# Patient Record
Sex: Male | Born: 1985 | Race: Black or African American | Hispanic: No | Smoking: Current some day smoker
Health system: Southern US, Community
[De-identification: ages and names within clinical notes are randomized; demographics above are authoritative.]

## PROBLEM LIST (undated history)

## (undated) DIAGNOSIS — R011 Cardiac murmur, unspecified: Secondary | ICD-10-CM

---

## 2010-09-07 ENCOUNTER — Emergency Department (HOSPITAL_COMMUNITY): Admission: EM | Admit: 2010-09-07 | Discharge: 2010-09-07 | Payer: Self-pay | Admitting: Emergency Medicine

## 2013-02-20 ENCOUNTER — Encounter (HOSPITAL_COMMUNITY): Payer: Self-pay | Admitting: Emergency Medicine

## 2013-02-20 ENCOUNTER — Emergency Department (HOSPITAL_COMMUNITY): Payer: Self-pay

## 2013-02-20 ENCOUNTER — Emergency Department (HOSPITAL_COMMUNITY)
Admission: EM | Admit: 2013-02-20 | Discharge: 2013-02-20 | Disposition: A | Discharge status: Home / Self Care | Payer: Self-pay | Attending: Emergency Medicine | Admitting: Emergency Medicine

## 2013-02-20 DIAGNOSIS — R011 Cardiac murmur, unspecified: Secondary | ICD-10-CM | POA: Insufficient documentation

## 2013-02-20 DIAGNOSIS — Z862 Personal history of diseases of the blood and blood-forming organs and certain disorders involving the immune mechanism: Secondary | ICD-10-CM | POA: Insufficient documentation

## 2013-02-20 DIAGNOSIS — Z87828 Personal history of other (healed) physical injury and trauma: Secondary | ICD-10-CM | POA: Insufficient documentation

## 2013-02-20 DIAGNOSIS — G8911 Acute pain due to trauma: Secondary | ICD-10-CM | POA: Insufficient documentation

## 2013-02-20 DIAGNOSIS — M79673 Pain in unspecified foot: Secondary | ICD-10-CM

## 2013-02-20 DIAGNOSIS — Z8639 Personal history of other endocrine, nutritional and metabolic disease: Secondary | ICD-10-CM | POA: Insufficient documentation

## 2013-02-20 HISTORY — DX: Cardiac murmur, unspecified: R01.1

## 2013-02-20 MED ORDER — OXYCODONE-ACETAMINOPHEN 5-325 MG PO TABS
2.0000 | ORAL_TABLET | Freq: Once | ORAL | Status: AC
  Administered 2013-02-20: 2 via ORAL
  Filled 2013-02-20: qty 2

## 2013-02-20 MED ORDER — OXYCODONE-ACETAMINOPHEN 5-325 MG PO TABS: 2.0000 | ORAL_TABLET | ORAL | Status: DC | PRN

## 2013-02-20 MED ORDER — IBUPROFEN 800 MG PO TABS: 800.0000 mg | ORAL_TABLET | Freq: Three times a day (TID) | ORAL | Status: DC

## 2013-02-20 NOTE — ED Provider Notes (Signed)
History    This chart was scribed for Glynn Octave, MD by Charolett Bumpers, ED Scribe. The patient was seen in room APA09/APA09. Patient's care was started at 1332.   CSN: [id number]  Arrival date & time 02/20/13  1311   First MD Initiated Contact with Patient 02/20/13 1332      Chief Complaint  Patient presents with  . Foot Injury    The history is provided by the patient. No language interpreter was used.   Victor Marks is a 27 y.o. male who presents to the Emergency Department complaining of constant, severe right foot pain located at the heel after stepping on a piece of tin that went through his foot 3 weeks ago. He was seen afterwards at [facility] where the metal was removed. He states that a friend RN removed the stitches 2 days ago. He states that he has had foot pain since the injury and worsened last night when he felt a ripping sensation. He states that he was been ambulating on the ball of his foot for 3 weeks to avoid putting pressure of the wound. He denies any drainage or bleeding. He states that he has taken Hydrocodone #5, naproxen and antibiotics. He is a borderline diabetic but does not take any medications.   Past Medical History  Diagnosis Date  . Heart murmur     History reviewed. No pertinent past surgical history.  No family history on file.  History  Substance Use Topics  . Smoking status: Not on file  . Smokeless tobacco: Not on file  . Alcohol Use: Not on file      Review of Systems A complete 10 system review of systems was obtained and all systems are negative except as noted in the HPI and PMH.   Allergies  Review of patient's allergies indicates no known allergies.  Home Medications  No current outpatient prescriptions on file.  BP 128/65  Pulse 92  Temp(Src) 98.4 F (36.9 C) (Oral)  Resp 18  Ht 5\' 8"  (1.727 m)  Wt 160 lb (72.576 kg)  BMI 24.33 kg/m2  SpO2 100%  Physical Exam  Nursing note and vitals  reviewed. Constitutional: He is oriented to person, place, and time. He appears well-developed and well-nourished. No distress.  HENT:  Head: Normocephalic and atraumatic.  Right Ear: External ear normal.  Left Ear: External ear normal.  Nose: Nose normal.  Mouth/Throat: Oropharynx is clear and moist. No oropharyngeal exudate.  Eyes: Conjunctivae and EOM are normal. Pupils are equal, round, and reactive to light.  Neck: Normal range of motion. Neck supple. No tracheal deviation present.  Cardiovascular: Normal rate, regular rhythm and normal heart sounds.   Pulmonary/Chest: Effort normal and breath sounds normal. No respiratory distress.  Abdominal: Soft. Bowel sounds are normal. He exhibits no distension. There is no tenderness.  Musculoskeletal: Normal range of motion. He exhibits no edema.  Healing 2 cm transverse laceration to right calcaneus. No erythema, fluctuance or induration noted. +2 DP and TP pulses. Able to wiggle toes. Sensation intact. Ankle flexion/extension intact.   Neurological: He is alert and oriented to person, place, and time.  Skin: Skin is warm and dry.  Psychiatric: He has a normal mood and affect. His behavior is normal.    ED Course  Procedures (including critical care time)  DIAGNOSTIC STUDIES: Oxygen Saturation is 100% on room air, normal by my interpretation.    COORDINATION OF CARE:  13:40-Discussed planned course of treatment with the patient including pain management  and x-ray, who is agreeable at this time.   13:45-Medication Orders: Oxycodone-acetaminophen (Percocet/Roxicet) 5-325 mg per tablet 2 tablet-once.   14:26-Recheck: Informed pt of imaging results. Will d/c home with hard boot, pain medication and antiinflammatories and advised to f/u with ortho for continued pain.   Labs Reviewed - No data to display Dg Os Calcis Right  02/20/2013  *RADIOLOGY REPORT*  Clinical Data: Rule out foreign body  RIGHT OS CALCIS - 2+ VIEW  Comparison: None.   Findings: Three views of the right calcaneus submitted.  No radiopaque foreign body is identified.  Skin laceration is noted in the posterior plantar aspect.  No acute fracture or subluxation.  IMPRESSION: No acute fracture or subluxation.   Original Report Authenticated By: Natasha Mead, M.D.      No diagnosis found.    MDM  Patient complains of pain to his right heel after sustaining a laceration from a piece of metal 3 weeks ago. He was seen at an outside hospital and had this wound is repaired. Sutures removed by a friend 2 days ago. Complains of pain in the heel and difficulty walking to the point where he has to walk on the ball of his foot. He stepped awkwardly last night and felt a ripping sensation in his foot. Denies fevers, chills, weakness, numbness or tingling. No bleeding or drainage.  Wound appears to be healing well. There is no erythema, fluctuance, drainage or bleeding. There is minimal pain to palpation. Foot is neurovascularly intact. Compartments soft.  X-ray obtained to rule out retained foreign body. X-ray negative. No evidence of infection. Encouraged patient to try to walk normally. Orthopedic referral given.   I personally performed the services described in this documentation, which was scribed in my presence. The recorded information has been reviewed and is accurate.       Glynn Octave, MD 02/20/13 1447

## 2013-02-20 NOTE — ED Notes (Signed)
States that he stepped on a piece of metal with his right foot about 3 weeks ago.  States that he continues to have problems with the foot.  States that he did leave the sutures in for too long.  States he is unable to put weight on his right foot.

## 2013-02-20 NOTE — ED Notes (Signed)
Pt reports stepped on piece of tin 3 weeks ago and cut r heel.  Reports had sutures removed 2 days ago but still having severe pain and unable to apply any pressure.  Pt says felt like something "ripped" last night.

## 2014-09-30 ENCOUNTER — Emergency Department (HOSPITAL_COMMUNITY): Payer: Self-pay

## 2014-09-30 ENCOUNTER — Emergency Department (HOSPITAL_COMMUNITY)
Admission: EM | Admit: 2014-09-30 | Discharge: 2014-09-30 | Disposition: A | Discharge status: Home / Self Care | Payer: Self-pay | Attending: Emergency Medicine | Admitting: Emergency Medicine

## 2014-09-30 ENCOUNTER — Encounter (HOSPITAL_COMMUNITY): Payer: Self-pay | Admitting: Emergency Medicine

## 2014-09-30 DIAGNOSIS — R202 Paresthesia of skin: Secondary | ICD-10-CM | POA: Insufficient documentation

## 2014-09-30 DIAGNOSIS — R531 Weakness: Secondary | ICD-10-CM | POA: Insufficient documentation

## 2014-09-30 DIAGNOSIS — R55 Syncope and collapse: Secondary | ICD-10-CM | POA: Insufficient documentation

## 2014-09-30 DIAGNOSIS — I351 Nonrheumatic aortic (valve) insufficiency: Secondary | ICD-10-CM | POA: Insufficient documentation

## 2014-09-30 DIAGNOSIS — R000 Tachycardia, unspecified: Secondary | ICD-10-CM | POA: Insufficient documentation

## 2014-09-30 LAB — BASIC METABOLIC PANEL
Anion gap: 14 (ref 5–15)
BUN: 13 mg/dL (ref 6–23)
CALCIUM: 9.8 mg/dL (ref 8.4–10.5)
CO2: 25 mEq/L (ref 19–32)
Chloride: 102 mEq/L (ref 96–112)
Creatinine, Ser: 1.11 mg/dL (ref 0.50–1.35)
GFR, EST NON AFRICAN AMERICAN: 88 mL/min — AB (ref >90)
Glucose, Bld: 119 mg/dL — ABNORMAL HIGH (ref 70–99)
POTASSIUM: 4.5 meq/L (ref 3.7–5.3)
SODIUM: 141 meq/L (ref 137–147)

## 2014-09-30 LAB — CBC WITH DIFFERENTIAL/PLATELET
BASOS PCT: 0 % (ref 0–1)
Basophils Absolute: 0 10*3/uL (ref 0.0–0.1)
EOS ABS: 0.2 10*3/uL (ref 0.0–0.7)
EOS PCT: 3 % (ref 0–5)
HCT: 44.3 % (ref 39.0–52.0)
Hemoglobin: 15.4 g/dL (ref 13.0–17.0)
Lymphocytes Relative: 29 % (ref 12–46)
Lymphs Abs: 2.1 10*3/uL (ref 0.7–4.0)
MCH: 31.5 pg (ref 26.0–34.0)
MCHC: 34.8 g/dL (ref 30.0–36.0)
MCV: 90.6 fL (ref 78.0–100.0)
Monocytes Absolute: 0.7 10*3/uL (ref 0.1–1.0)
Monocytes Relative: 10 % (ref 3–12)
NEUTROS PCT: 58 % (ref 43–77)
Neutro Abs: 4.1 10*3/uL (ref 1.7–7.7)
PLATELETS: 256 10*3/uL (ref 150–400)
RBC: 4.89 MIL/uL (ref 4.22–5.81)
RDW: 12.7 % (ref 11.5–15.5)
WBC: 7.2 10*3/uL (ref 4.0–10.5)

## 2014-09-30 LAB — I-STAT TROPONIN, ED: TROPONIN I, POC: 0 ng/mL (ref 0.00–0.08)

## 2014-09-30 MED ORDER — FENTANYL CITRATE 0.05 MG/ML IJ SOLN
100.0000 ug | Freq: Once | INTRAMUSCULAR | Status: AC
  Administered 2014-09-30: 100 ug via INTRAVENOUS
  Filled 2014-09-30: qty 2

## 2014-09-30 MED ORDER — SODIUM CHLORIDE 0.9 % IV BOLUS (SEPSIS)
1000.0000 mL | Freq: Once | INTRAVENOUS | Status: AC
  Administered 2014-09-30: 1000 mL via INTRAVENOUS

## 2014-09-30 NOTE — Discharge Instructions (Signed)
Your caregiver has seen you today because you are having problems with feelings of fainting. Fainting has many different causes, some of which are common and others are very rare. Your caregiver has considered some of the most common causes of fainting and feels it is safe for you to go home and be observed. Not every illness or injury can be identified during an emergency department visit, thus follow-up with your primary healthcare provider is important. Medical conditions can also worsen, so it is also important to return immediately as directed below, or if you have other serious concerns develop. RETURN IMMEDIATELY IF you develop new shortness of breath, chest pain, fever, have difficulty moving parts of your body (new weakness, numbness, or incoordination), sudden change in speech, vision, swallowing, or understanding, faint or develop new dizziness, severe headache, become poorly responsive or have an altered mental status compared to baseline for you, new rash, abdominal pain, or bloody stools,  Return sooner also if you develop new problems for which you have not talked to your caregiver but you feel may be emergency medical conditions, or are unable to be cared for safely at home.

## 2014-09-30 NOTE — ED Notes (Signed)
Dr. Bednar at bedside. 

## 2014-09-30 NOTE — ED Notes (Signed)
Pt denies chest pain, admits to mild chest tightness.

## 2014-09-30 NOTE — ED Notes (Signed)
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  States he just doesn't feel right. C/o headache.

## 2014-09-30 NOTE — ED Provider Notes (Signed)
CSN: [id number]     Arrival date & time 09/30/14  1803 History   First MD Initiated Contact with Patient 09/30/14 1823     Chief Complaint  Patient presents with  . Loss of Consciousness     (Consider location/radiation/quality/duration/timing/severity/associated sxs/prior Treatment) HPI 28 year old healthy athletic male no prior medical history, was working at [employer] this afternoon when he felt lightheaded felt as if he might faint felt generally weak and then felt his legs give out and had brief witnessed syncopal episode for less than a couple minutes with no head trauma as a coworker caught him so he did not hurt himself, patient woke up and now feels generally weak but is no longer lightheaded, he does feel as if he might be a little bit dehydrated, he is no headache no head trauma no neck pain no chest pain no palpitations no shortness of breath no cough no fever no lateralizing weakness or numbness or incoordination he does have slight tingling to both feet with no history of exertional syncope exertional chest pain exertional shortness of breath or other concerns. He does not have syncope previously. He had no confusion afterwards with no seizure-like activity reported. Nursing note recorded chest tightness and headache but the patient denies both of these symptoms. Past Medical History  Diagnosis Date  . Heart murmur    History reviewed. No pertinent past surgical history. History reviewed. No pertinent family history. History  Substance Use Topics  . Smoking status: Never Smoker   . Smokeless tobacco: Not on file  . Alcohol Use: Yes     Comment: occ    Review of Systems 10 Systems reviewed and are negative for acute change except as noted in the HPI.   Allergies  Bee venom  Home Medications   Prior to Admission medications   Not on File   BP 135/81  Pulse 92  Resp 19  SpO2 99% Physical Exam  Nursing note and vitals reviewed. Constitutional:  Awake, alert,  nontoxic appearance.  HENT:  Head: Atraumatic.  Eyes: Right eye exhibits no discharge. Left eye exhibits no discharge.  Neck: Neck supple.  Cardiovascular: Regular rhythm.   No murmur heard. Mildly tachycardic  Pulmonary/Chest: Effort normal and breath sounds normal. No respiratory distress. He has no wheezes. He has no rales. He exhibits no tenderness.  Pulse oximetry normal room air 100%  Abdominal: Soft. Bowel sounds are normal. He exhibits no distension. There is no tenderness. There is no rebound and no guarding.  Musculoskeletal: He exhibits no edema and no tenderness.  Baseline ROM, no obvious new focal weakness.  Neurological: He is alert.  Mental status and motor strength appears baseline for patient and situation.  Skin: No rash noted.  Psychiatric: He has a normal mood and affect.    ED Course  Procedures (including critical care time) Patient did not arrive with headache however he developed a severe headache in the ED so CT scan was ordered.  Neurologic examination extraocular movements intact, peripheral visual fields full to confrontation, no facial asymmetry, no facial light touch, normal tongue movements, normal light touch both arms and both legs, no pronator drift in arms, normal bilateral finger to nose testing, normal gait, despite subjective paresthesias to the plantar aspect of both feet both feet have normal light touch, both feet have dorsalis pedis pulses intact, both feet have capillary refill less than 2 seconds no tenderness with good movement and normal color  Labs Review Labs Reviewed  BASIC METABOLIC PANEL -  Abnormal; Notable for the following:    Glucose, Bld 119 (*)    GFR calc non Af Amer 88 (*)    All other components within normal limits  CBC WITH DIFFERENTIAL  I-STAT TROPOININ, ED    Imaging Review Ct Head Wo Contrast  09/30/2014   CLINICAL DATA:  Three days of headache; syncopal episode today while that were ; patient reports feeling numb  and weak; initial visit  EXAM: CT HEAD WITHOUT CONTRAST  TECHNIQUE: Contiguous axial images were obtained from the base of the skull through the vertex without intravenous contrast.  COMPARISON:  None.  FINDINGS: The ventricles are normal in size and position. There is no intracranial hemorrhage nor intracranial mass effect. There is no acute ischemic change. The cerebellum and brainstem are normal.  The observed paranasal sinuses and mastoid air cells are clear. There is no acute skull fracture.  IMPRESSION: Normal noncontrast CT scan of the brain.   Electronically Signed   By: David  [place]   On: 09/30/2014 20:03     EKG Interpretation   Date/Time:  Saturday September 30 2014 18:23:11 EDT Ventricular Rate:  100 PR Interval:  128 QRS Duration: 76 QT Interval:  329 QTC Calculation: 424 R Axis:   95 Text Interpretation:  Sinus tachycardia Non-specific ST-t changes Diffuse  leads No significant change since last tracing Confirmed by [facility]  MD,  Jonny Ruiz ([id number]) on 09/30/2014 6:37:26 PM      MDM   Final diagnoses:  Syncope, unspecified syncope type    Patient / Family / Caregiver informed of clinical course, understand medical decision-making process, and agree with plan. I doubt any other EMC precluding discharge at this time including, but not necessarily limited to the following:SAH, CVA, cardiac dysrhythmia.    Hurman Horn, MD 10/02/14 [id number]

## 2014-09-30 NOTE — ED Notes (Signed)
Dr. Fonnie JarvisBednar given ekg.

## 2016-11-08 ENCOUNTER — Encounter (HOSPITAL_COMMUNITY): Payer: Self-pay | Admitting: Emergency Medicine

## 2016-11-08 ENCOUNTER — Emergency Department (HOSPITAL_COMMUNITY): Payer: Self-pay

## 2016-11-08 ENCOUNTER — Emergency Department (HOSPITAL_COMMUNITY)
Admission: EM | Admit: 2016-11-08 | Discharge: 2016-11-09 | Disposition: A | Discharge status: Home / Self Care | Payer: Self-pay | Attending: Emergency Medicine | Admitting: Emergency Medicine

## 2016-11-08 DIAGNOSIS — F17210 Nicotine dependence, cigarettes, uncomplicated: Secondary | ICD-10-CM | POA: Insufficient documentation

## 2016-11-08 DIAGNOSIS — M79604 Pain in right leg: Secondary | ICD-10-CM | POA: Insufficient documentation

## 2016-11-08 DIAGNOSIS — T1490XA Injury, unspecified, initial encounter: Secondary | ICD-10-CM

## 2016-11-08 DIAGNOSIS — F1290 Cannabis use, unspecified, uncomplicated: Secondary | ICD-10-CM | POA: Insufficient documentation

## 2016-11-08 DIAGNOSIS — Y9389 Activity, other specified: Secondary | ICD-10-CM | POA: Insufficient documentation

## 2016-11-08 DIAGNOSIS — W131XXA Fall from, out of or through bridge, initial encounter: Secondary | ICD-10-CM | POA: Insufficient documentation

## 2016-11-08 DIAGNOSIS — R938 Abnormal findings on diagnostic imaging of other specified body structures: Secondary | ICD-10-CM | POA: Insufficient documentation

## 2016-11-08 DIAGNOSIS — Y999 Unspecified external cause status: Secondary | ICD-10-CM | POA: Insufficient documentation

## 2016-11-08 DIAGNOSIS — R51 Headache: Secondary | ICD-10-CM | POA: Insufficient documentation

## 2016-11-08 DIAGNOSIS — F1490 Cocaine use, unspecified, uncomplicated: Secondary | ICD-10-CM | POA: Insufficient documentation

## 2016-11-08 DIAGNOSIS — M545 Low back pain: Secondary | ICD-10-CM | POA: Insufficient documentation

## 2016-11-08 DIAGNOSIS — W19XXXA Unspecified fall, initial encounter: Secondary | ICD-10-CM

## 2016-11-08 DIAGNOSIS — Y9289 Other specified places as the place of occurrence of the external cause: Secondary | ICD-10-CM | POA: Insufficient documentation

## 2016-11-08 LAB — COMPREHENSIVE METABOLIC PANEL
ALBUMIN: 4 g/dL (ref 3.5–5.0)
ALK PHOS: 83 U/L (ref 38–126)
ALT: 26 U/L (ref 17–63)
AST: 46 U/L — AB (ref 15–41)
Anion gap: 10 (ref 5–15)
BILIRUBIN TOTAL: 0.9 mg/dL (ref 0.3–1.2)
BUN: 17 mg/dL (ref 6–20)
CALCIUM: 9.8 mg/dL (ref 8.9–10.3)
CO2: 21 mmol/L — AB (ref 22–32)
Chloride: 108 mmol/L (ref 101–111)
Creatinine, Ser: 1.25 mg/dL — ABNORMAL HIGH (ref 0.61–1.24)
GFR calc Af Amer: >60 mL/min (ref >60)
GFR calc non Af Amer: >60 mL/min (ref >60)
GLUCOSE: 95 mg/dL (ref 65–99)
Potassium: 3.6 mmol/L (ref 3.5–5.1)
SODIUM: 139 mmol/L (ref 135–145)
TOTAL PROTEIN: 6.5 g/dL (ref 6.5–8.1)

## 2016-11-08 LAB — I-STAT CHEM 8, ED
BUN: 19 mg/dL (ref 6–20)
CHLORIDE: 108 mmol/L (ref 101–111)
Calcium, Ion: 1.08 mmol/L — ABNORMAL LOW (ref 1.15–1.40)
Creatinine, Ser: 1.3 mg/dL — ABNORMAL HIGH (ref 0.61–1.24)
Glucose, Bld: 91 mg/dL (ref 65–99)
HEMATOCRIT: 39 % (ref 39.0–52.0)
Hemoglobin: 13.3 g/dL (ref 13.0–17.0)
POTASSIUM: 3.6 mmol/L (ref 3.5–5.1)
SODIUM: 142 mmol/L (ref 135–145)
TCO2: 22 mmol/L (ref 0–100)

## 2016-11-08 LAB — SAMPLE TO BLOOD BANK

## 2016-11-08 LAB — URINALYSIS, ROUTINE W REFLEX MICROSCOPIC
BILIRUBIN URINE: NEGATIVE
GLUCOSE, UA: NEGATIVE mg/dL
HGB URINE DIPSTICK: NEGATIVE
Ketones, ur: NEGATIVE mg/dL
Leukocytes, UA: NEGATIVE
Nitrite: NEGATIVE
Protein, ur: NEGATIVE mg/dL
SPECIFIC GRAVITY, URINE: 1.042 — AB (ref 1.005–1.030)
pH: 7 (ref 5.0–8.0)

## 2016-11-08 LAB — CDS SEROLOGY

## 2016-11-08 LAB — ETHANOL

## 2016-11-08 LAB — CBC
HEMATOCRIT: 40.7 % (ref 39.0–52.0)
Hemoglobin: 14.1 g/dL (ref 13.0–17.0)
MCH: 30.9 pg (ref 26.0–34.0)
MCHC: 34.6 g/dL (ref 30.0–36.0)
MCV: 89.1 fL (ref 78.0–100.0)
Platelets: 268 10*3/uL (ref 150–400)
RBC: 4.57 MIL/uL (ref 4.22–5.81)
RDW: 12.9 % (ref 11.5–15.5)
WBC: 6.3 10*3/uL (ref 4.0–10.5)

## 2016-11-08 LAB — PROTIME-INR
INR: 0.92
Prothrombin Time: 12.4 seconds (ref 11.4–15.2)

## 2016-11-08 LAB — I-STAT CG4 LACTIC ACID, ED: LACTIC ACID, VENOUS: 1.66 mmol/L (ref 0.5–1.9)

## 2016-11-08 MED ORDER — HYDROMORPHONE HCL 2 MG/ML IJ SOLN
INTRAMUSCULAR | Status: AC
  Filled 2016-11-08: qty 1

## 2016-11-08 MED ORDER — DIAZEPAM 5 MG/ML IJ SOLN
INTRAMUSCULAR | Status: AC
  Filled 2016-11-08: qty 2

## 2016-11-08 MED ORDER — IOPAMIDOL (ISOVUE-300) INJECTION 61%
INTRAVENOUS | Status: AC
  Administered 2016-11-08: 100 mL
  Filled 2016-11-08: qty 100

## 2016-11-08 MED ORDER — DIAZEPAM 5 MG/ML IJ SOLN
5.0000 mg | Freq: Once | INTRAMUSCULAR | Status: AC
  Administered 2016-11-08: 5 mg via INTRAVENOUS

## 2016-11-08 MED ORDER — HYDROMORPHONE HCL 1 MG/ML IJ SOLN
INTRAMUSCULAR | Status: AC | PRN
  Administered 2016-11-08 (×2): 1 mg via INTRAVENOUS

## 2016-11-08 MED ORDER — SODIUM CHLORIDE 0.9 % IV SOLN
INTRAVENOUS | Status: AC | PRN
  Administered 2016-11-08: 1000 mL via INTRAVENOUS
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
## 2016-11-08 NOTE — ED Notes (Signed)
Family at Bedside  

## 2016-11-08 NOTE — Progress Notes (Signed)
   11/08/16 2200  Clinical Encounter Type  Visited With Family  Visit Type Follow-up  Spiritual Encounters  Spiritual Needs Emotional  Stress Factors  Patient Stress Factors Not reviewed  Family Stress Factors Family relationships  Introduction to family. No services requested.

## 2016-11-08 NOTE — Progress Notes (Signed)
Orthopedic Tech Progress Note Patient Details:  Victor Marks 10/12/1986 [id number] Level 2 trauma ortho visit. Patient ID: Victor Marks, male   DOB: 08/15/1986, 30 y.o.   MRN: [id number]   Victor Marks, Victor Marks 11/08/2016, 9:16 PM

## 2016-11-08 NOTE — Progress Notes (Signed)
   11/08/16 2300  Clinical Encounter Type  Visited With Family  Visit Type Social support  Referral From Nurse  Consult/Referral To Chaplain  Spiritual Encounters  Spiritual Needs Prayer;Emotional  Stress Factors  Patient Stress Factors Health changes  Family Stress Factors Family relationships  Paged to speak with Pt's father. Father brought up suspicion of attempted suicide. Informed nurse who would pass on to Md. Offered short prayer.

## 2016-11-08 NOTE — ED Notes (Signed)
Patient to CT.

## 2016-11-08 NOTE — Progress Notes (Signed)
   11/08/16 2050  Clinical Encounter Type  Visited With Patient  Visit Type ED  Referral From Other (Comment) (level 2)  Spiritual Encounters  Spiritual Needs Emotional  Stress Factors  Patient Stress Factors Health changes  When Pt stabilized got family information from him and made contact. Awaiting arrival from family.

## 2016-11-09 LAB — RAPID URINE DRUG SCREEN, HOSP PERFORMED
Amphetamines: NOT DETECTED
BARBITURATES: NOT DETECTED
Benzodiazepines: NOT DETECTED
COCAINE: POSITIVE — AB
OPIATES: POSITIVE — AB
Tetrahydrocannabinol: POSITIVE — AB
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
## 2016-11-10 ENCOUNTER — Encounter (HOSPITAL_COMMUNITY): Payer: Self-pay | Admitting: Emergency Medicine

## 2017-12-21 IMAGING — CT CT L SPINE W/O CM
3 of 4 series · 12 of 33 positions shown, 14 images · IV contrast (Omni 300)
Comparison: None.

CLINICAL DATA: Pain after 30 foot fall.

EXAM:
CT LUMBAR SPINE WITHOUT CONTRAST
TECHNIQUE: Multidetector CT imaging of the lumbar spine was performed without
intravenous contrast administration. Multiplanar CT image
reconstructions were also generated.

[Series 1: lspine · sagittal · 0.28mm/px · 5 of 72 slices shown, 6 images (1 of 2)]
[im 24/72  bone]
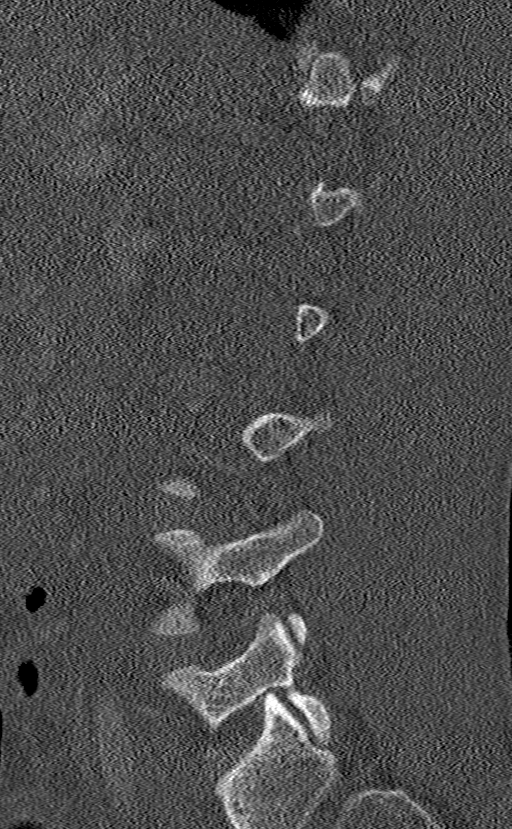
[im 30/72  bone]
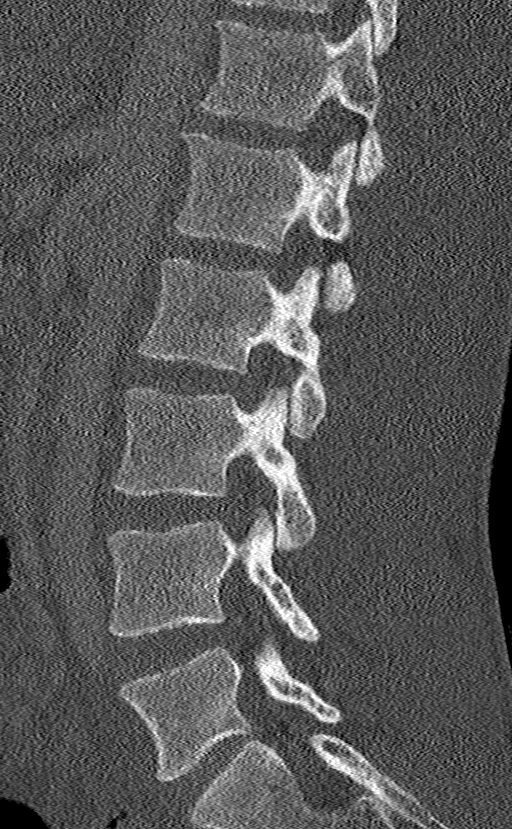
[im 36/72  soft-tissue]
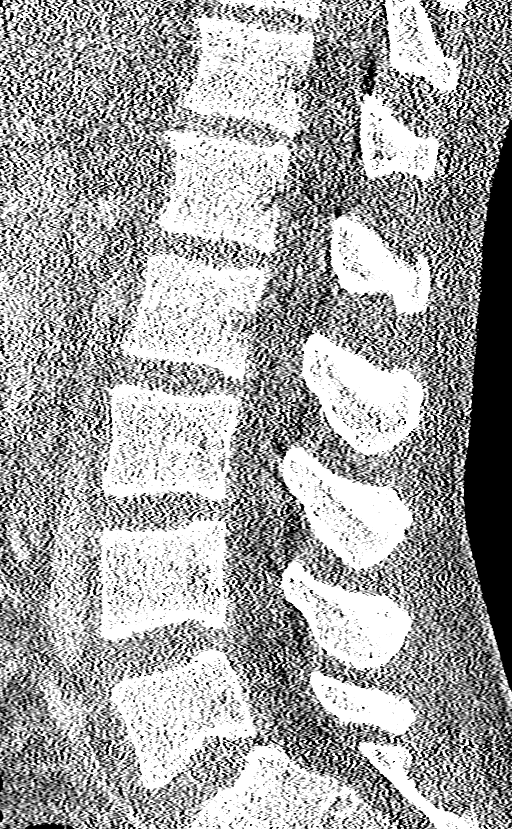
[im 36/72  bone]
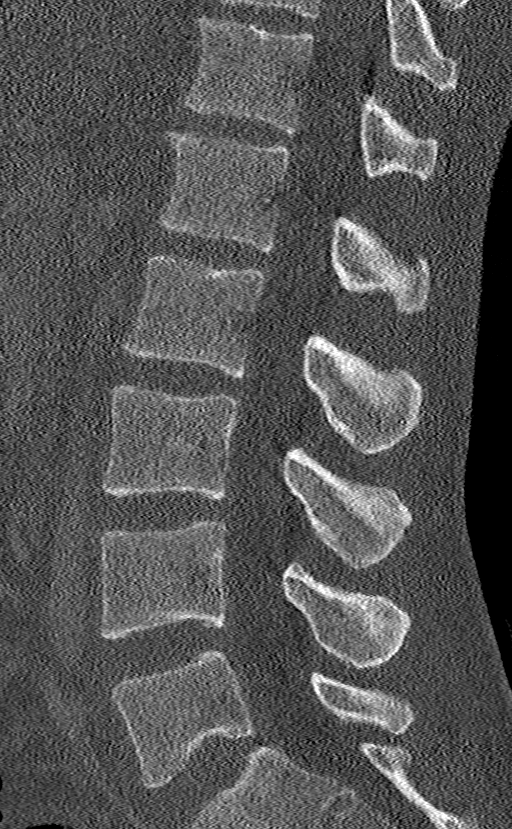
[im 42/72  bone]
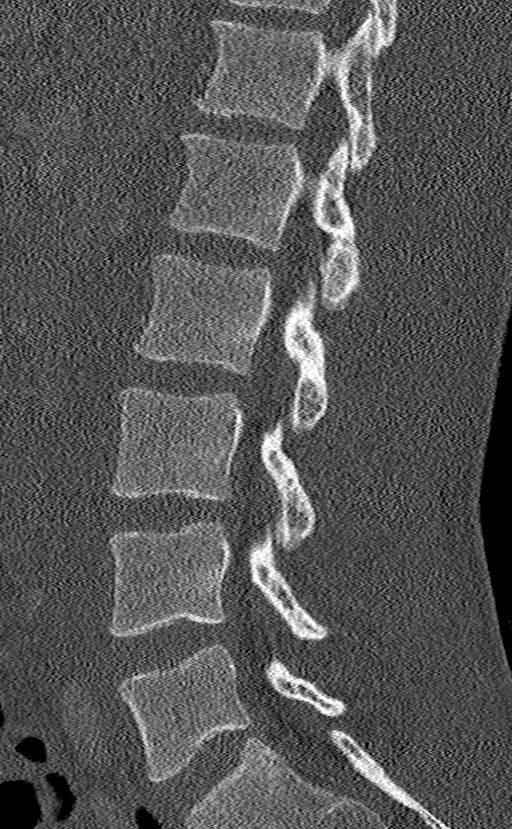
[im 48/72  bone]
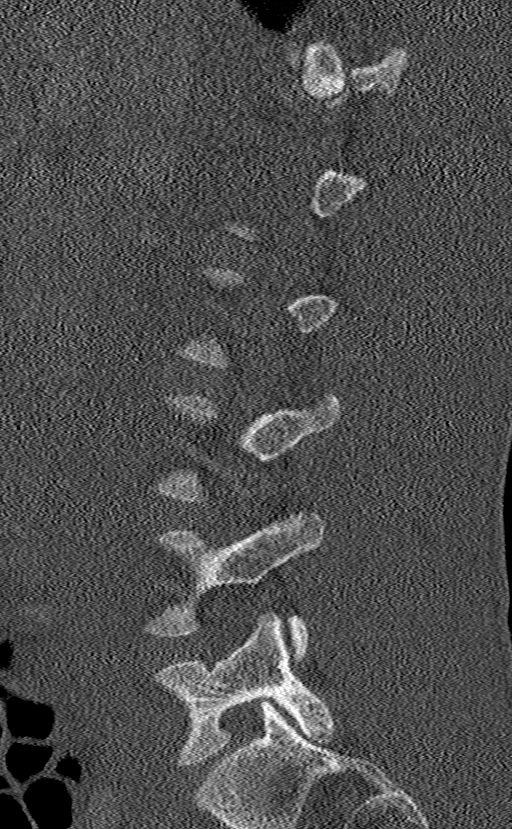

[Series 2: lspine · axial · 0.28mm/px · z∈[-700,-536]mm · 4 of 116 slices shown, 5 images (2 of 2)]
[im 17/116  soft-tissue]
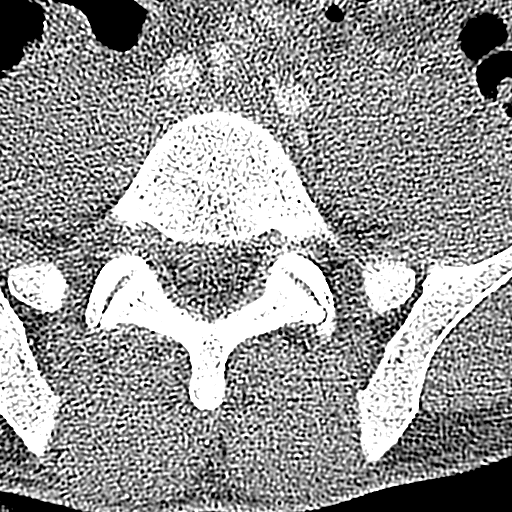
[im 17/116  bone]
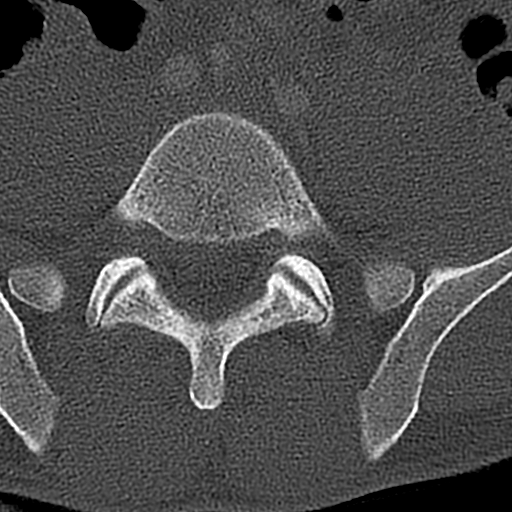
[im 50/116  bone]
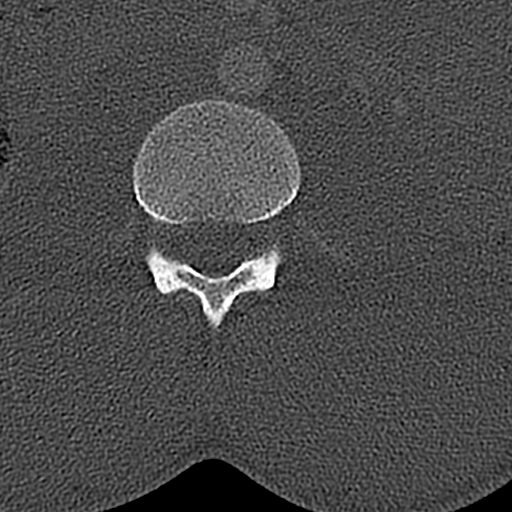
[im 66/116  bone]
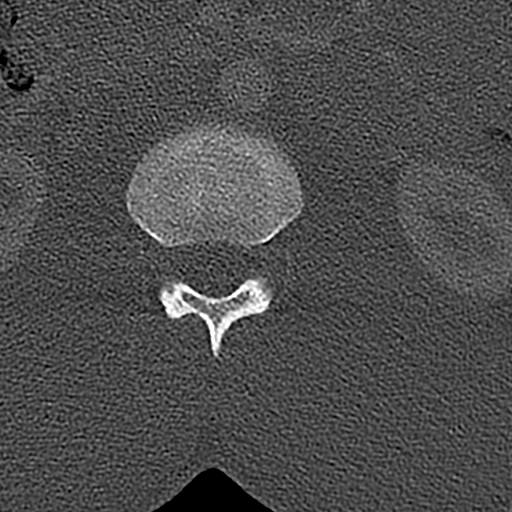
[im 99/116  bone]
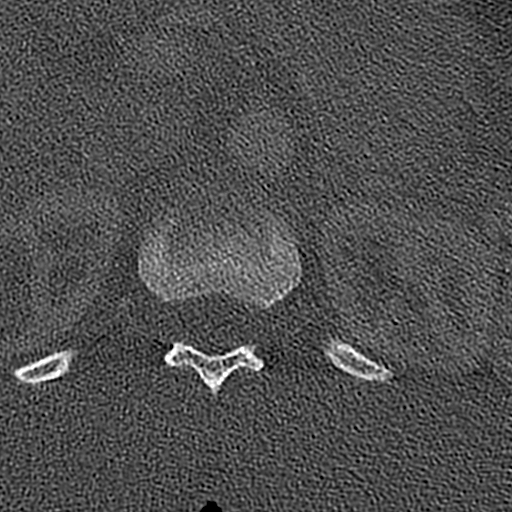

[Series 4: lspine cor bone · coronal · 0.28mm/px · 3 of 72 slices shown]
[im 15/72  bone]
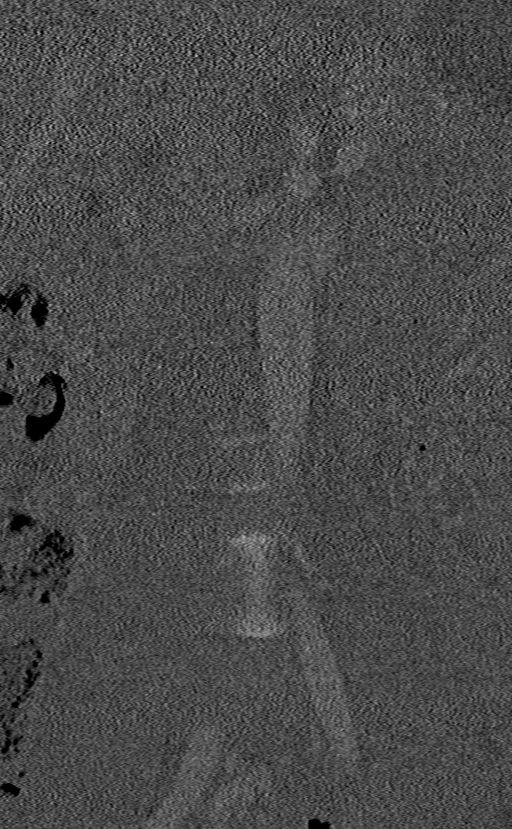
[im 29/72  bone]
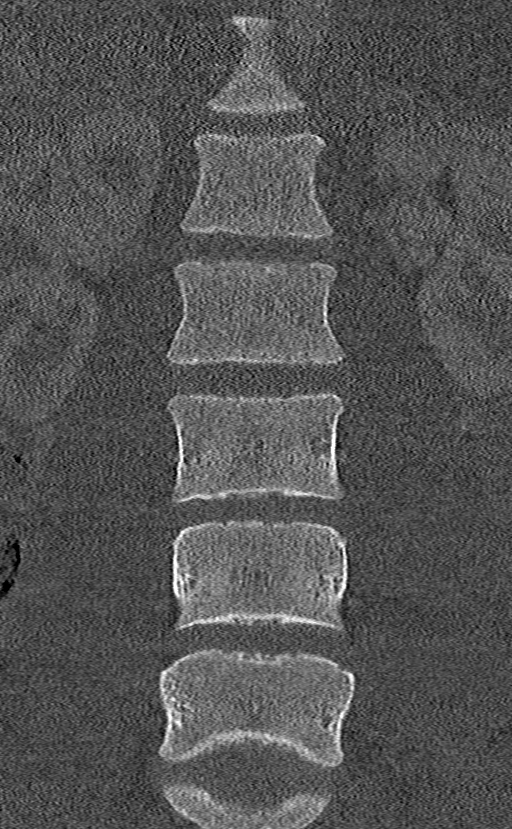
[im 43/72  bone]
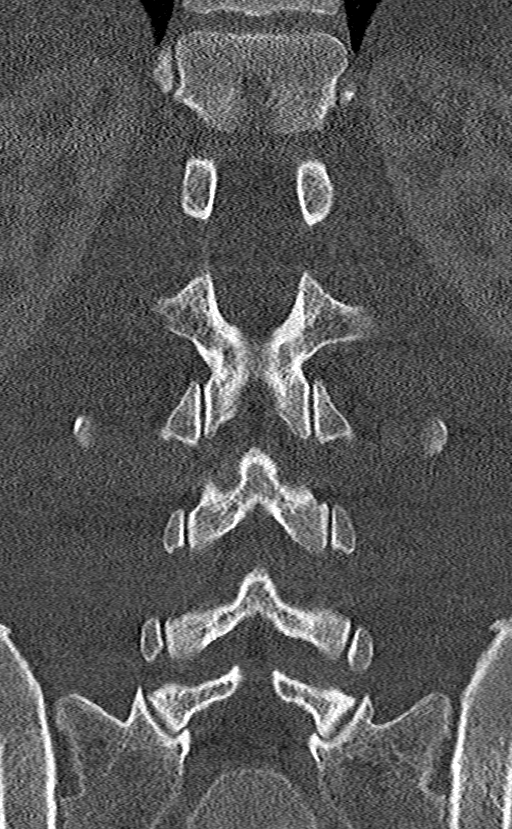

[12 of 33 positions shown; findings below may reference images not displayed]

FINDINGS: Segmentation: 5 lumbar type vertebrae.

Alignment: Normal.

Vertebrae: No acute fracture or focal pathologic process. Sacroiliac
joint spurring with left greater than right bony bridging.

Paraspinal and other soft tissues: No paraspinal/retroperitoneal
hematoma. No intraspinal hemorrhage.

Disc levels: No significant disc space narrowing. No canal stenosis.
IMPRESSION: No acute fracture of the lumbar spine. Maintained vertebral body
heights and disc spaces.
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
## 2019-01-11 ENCOUNTER — Emergency Department (HOSPITAL_COMMUNITY)
Admission: EM | Admit: 2019-01-11 | Discharge: 2019-01-11 | Attending: Emergency Medicine | Admitting: Emergency Medicine

## 2019-01-11 ENCOUNTER — Emergency Department (HOSPITAL_COMMUNITY)

## 2019-01-11 ENCOUNTER — Other Ambulatory Visit: Payer: Self-pay

## 2019-01-11 ENCOUNTER — Encounter (HOSPITAL_COMMUNITY): Payer: Self-pay | Admitting: Emergency Medicine

## 2019-01-11 DIAGNOSIS — M545 Low back pain, unspecified: Secondary | ICD-10-CM

## 2019-01-11 DIAGNOSIS — F17210 Nicotine dependence, cigarettes, uncomplicated: Secondary | ICD-10-CM | POA: Diagnosis not present

## 2019-01-11 DIAGNOSIS — R05 Cough: Secondary | ICD-10-CM

## 2019-01-11 DIAGNOSIS — R109 Unspecified abdominal pain: Secondary | ICD-10-CM | POA: Diagnosis not present

## 2019-01-11 DIAGNOSIS — R55 Syncope and collapse: Secondary | ICD-10-CM | POA: Diagnosis present

## 2019-01-11 DIAGNOSIS — R059 Cough, unspecified: Secondary | ICD-10-CM

## 2019-01-11 DIAGNOSIS — J101 Influenza due to other identified influenza virus with other respiratory manifestations: Secondary | ICD-10-CM | POA: Diagnosis not present

## 2019-01-11 DIAGNOSIS — R112 Nausea with vomiting, unspecified: Secondary | ICD-10-CM

## 2019-01-11 LAB — COMPREHENSIVE METABOLIC PANEL
ALBUMIN: 4.4 g/dL (ref 3.5–5.0)
ALK PHOS: 63 U/L (ref 38–126)
ALT: 36 U/L (ref 0–44)
ANION GAP: 8 (ref 5–15)
AST: 45 U/L — ABNORMAL HIGH (ref 15–41)
BUN: 8 mg/dL (ref 6–20)
CALCIUM: 9.2 mg/dL (ref 8.9–10.3)
CO2: 25 mmol/L (ref 22–32)
Chloride: 105 mmol/L (ref 98–111)
Creatinine, Ser: 1.27 mg/dL — ABNORMAL HIGH (ref 0.61–1.24)
GFR calc non Af Amer: >60 mL/min (ref >60)
GLUCOSE: 95 mg/dL (ref 70–99)
Potassium: 4 mmol/L (ref 3.5–5.1)
SODIUM: 138 mmol/L (ref 135–145)
TOTAL PROTEIN: 7.8 g/dL (ref 6.5–8.1)
Total Bilirubin: 0.6 mg/dL (ref 0.3–1.2)

## 2019-01-11 LAB — CBC WITH DIFFERENTIAL/PLATELET
Abs Immature Granulocytes: 0.01 10*3/uL (ref 0.00–0.07)
BASOS ABS: 0 10*3/uL (ref 0.0–0.1)
Basophils Relative: 0 %
EOS PCT: 0 %
Eosinophils Absolute: 0 10*3/uL (ref 0.0–0.5)
HCT: 46.9 % (ref 39.0–52.0)
Hemoglobin: 15.4 g/dL (ref 13.0–17.0)
Immature Granulocytes: 0 %
LYMPHS PCT: 17 %
Lymphs Abs: 0.8 10*3/uL (ref 0.7–4.0)
MCH: 28.8 pg (ref 26.0–34.0)
MCHC: 32.8 g/dL (ref 30.0–36.0)
MCV: 87.7 fL (ref 80.0–100.0)
MONO ABS: 0.5 10*3/uL (ref 0.1–1.0)
Monocytes Relative: 12 %
NRBC: 0 % (ref 0.0–0.2)
Neutro Abs: 3.2 10*3/uL (ref 1.7–7.7)
Neutrophils Relative %: 71 %
Platelets: 168 10*3/uL (ref 150–400)
RBC: 5.35 MIL/uL (ref 4.22–5.81)
RDW: 12.3 % (ref 11.5–15.5)
WBC: 4.5 10*3/uL (ref 4.0–10.5)

## 2019-01-11 LAB — URINALYSIS, ROUTINE W REFLEX MICROSCOPIC
BACTERIA UA: NONE SEEN
BILIRUBIN URINE: NEGATIVE
GLUCOSE, UA: NEGATIVE mg/dL
KETONES UR: NEGATIVE mg/dL
LEUKOCYTES UA: NEGATIVE
NITRITE: NEGATIVE
PH: 6 (ref 5.0–8.0)
PROTEIN: 30 mg/dL — AB
Specific Gravity, Urine: 1.02 (ref 1.005–1.030)

## 2019-01-11 LAB — INFLUENZA PANEL BY PCR (TYPE A & B)
Influenza A By PCR: NEGATIVE
Influenza B By PCR: POSITIVE — AB

## 2019-01-11 MED ORDER — OSELTAMIVIR PHOSPHATE 75 MG PO CAPS: 75.0000 mg | ORAL_CAPSULE | Freq: Two times a day (BID) | ORAL | 0 refills | Status: DC

## 2019-01-11 MED ORDER — SODIUM CHLORIDE 0.9 % IV BOLUS (SEPSIS)
1000.0000 mL | Freq: Once | INTRAVENOUS | Status: AC
  Administered 2019-01-11: 1000 mL via INTRAVENOUS

## 2019-01-11 MED ORDER — ONDANSETRON HCL 4 MG/2ML IJ SOLN
4.0000 mg | Freq: Once | INTRAMUSCULAR | Status: AC
  Administered 2019-01-11: 4 mg via INTRAVENOUS
  Filled 2019-01-11: qty 2

## 2019-01-11 MED ORDER — KETOROLAC TROMETHAMINE 30 MG/ML IJ SOLN
30.0000 mg | Freq: Once | INTRAMUSCULAR | Status: AC
  Administered 2019-01-11: 30 mg via INTRAVENOUS
  Filled 2019-01-11: qty 1

## 2019-01-11 NOTE — ED Notes (Signed)
Patient transported to CT 

## 2019-01-11 NOTE — Discharge Instructions (Addendum)
Please move patient to lower bunk Pt should have rest time in his cell for  2 days

## 2019-01-11 NOTE — ED Provider Notes (Signed)
He is flu positive.  Will start Tamiflu.  He is otherwise nontoxic appropriate discharge   Zadie Rhine, MD 01/11/19 [phone]

## 2019-01-11 NOTE — ED Notes (Signed)
Patient discharged with officers from [facility] to be transferred back to prison.
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
## 2019-01-11 NOTE — ED Triage Notes (Signed)
Pt states he was treated for kidney stones on Saturday. States he was given ABX and discharged with orders to strain his urine. Pt states that he started having chills yesterday and now he is having vague pain again that starts in his back and travels to his testicles. Pt states he has not passed any kidney stone. Pt states he passed out today because he woke up with jail guards standing over him and that soon afterwards, he vomited once and it had some blood in it.

## 2019-01-12 LAB — URINE CULTURE: CULTURE: NO GROWTH

## 2019-05-27 ENCOUNTER — Other Ambulatory Visit: Payer: Self-pay

## 2019-05-27 ENCOUNTER — Emergency Department (HOSPITAL_COMMUNITY): Payer: Self-pay

## 2019-05-27 ENCOUNTER — Encounter (HOSPITAL_COMMUNITY): Payer: Self-pay | Admitting: *Deleted

## 2019-05-27 ENCOUNTER — Emergency Department (HOSPITAL_COMMUNITY)
Admission: EM | Admit: 2019-05-27 | Discharge: 2019-05-28 | Disposition: A | Discharge status: Home / Self Care | Payer: Self-pay | Attending: Emergency Medicine | Admitting: Emergency Medicine

## 2019-05-27 DIAGNOSIS — F17210 Nicotine dependence, cigarettes, uncomplicated: Secondary | ICD-10-CM | POA: Insufficient documentation

## 2019-05-27 DIAGNOSIS — R0602 Shortness of breath: Secondary | ICD-10-CM | POA: Insufficient documentation

## 2019-05-27 DIAGNOSIS — R1084 Generalized abdominal pain: Secondary | ICD-10-CM | POA: Insufficient documentation

## 2019-05-27 DIAGNOSIS — Z20828 Contact with and (suspected) exposure to other viral communicable diseases: Secondary | ICD-10-CM | POA: Insufficient documentation

## 2019-05-27 MED ORDER — SODIUM CHLORIDE 0.9 % IV BOLUS
1000.0000 mL | Freq: Once | INTRAVENOUS | Status: AC
  Administered 2019-05-27: 1000 mL via INTRAVENOUS

## 2019-05-27 MED ORDER — ONDANSETRON HCL 4 MG/2ML IJ SOLN
4.0000 mg | Freq: Once | INTRAMUSCULAR | Status: AC
  Administered 2019-05-27: 4 mg via INTRAVENOUS
  Filled 2019-05-27: qty 2

## 2019-05-27 NOTE — ED Provider Notes (Signed)
[facility] EMERGENCY DEPARTMENT Provider Note   CSN: [id number] Arrival date & time: 05/27/19  2202    History   Chief Complaint Chief Complaint  Patient presents with  . Shortness of Breath    HPI Clearnce HastenRoderick Marks is a 33 y.o. male.     HPI   Clearnce HastenRoderick Marks is a 33 y.o. male who presents to the Emergency Department complaining of cough, night sweats, shortness of breath, generalized body aches and loss of taste.  Symptoms have been present for 3 days.  He states he has been waking up during the night with sweats.  No weight loss, diaphoresis, chest pain or hemoptysis.  Cough has been non-productive.  No known fever, no coworkers or family members with similar symptoms.   He also complains of left lower abdominal pain for some time and states the pain has been gradually worsening.  He describes the pain as sharp and severe at times.  Pain waxes and wanes.  Pain is not been associated with food intake, vomiting, or diarrhea.   He has not been taking any over-the-counter cold medications or fever reducers.  No history of asthma.  Also complains of a dry itchy rash to his lower stomach.  No drainage or swelling.   Past Medical History:  Diagnosis Date  . Heart murmur     There are no active problems to display for this patient.   History reviewed. No pertinent surgical history.    Home Medications    Prior to Admission medications   Medication Sig Start Date End Date Taking? Authorizing Provider  hydrocortisone 2.5 % cream Apply 1 application topically 2 (two) times daily as needed (eczmea).    [provider]  oseltamivir (TAMIFLU) 75 MG capsule Take 1 capsule (75 mg total) by mouth every 12 (twelve) hours. 01/11/19   Zadie RhineWickline, Donald, MD    Family History History reviewed. No pertinent family history.  Social History Social History   Tobacco Use  . Smoking status: Current Some Day Smoker    Packs/day: 1.00    Types: Cigarettes  . Smokeless tobacco: Never  Used  Substance Use Topics  . Alcohol use: No    Comment: occ  . Drug use: No     Allergies   Bee venom and Bee venom   Review of Systems Review of Systems  Constitutional: Negative for appetite change, chills and fever.       Night sweats  HENT: Negative for congestion and sore throat.        Loss of taste  Respiratory: Positive for cough and shortness of breath.   Cardiovascular: Negative for chest pain.  Gastrointestinal: Positive for abdominal pain. Negative for blood in stool, diarrhea, nausea and vomiting.  Genitourinary: Negative for decreased urine volume, difficulty urinating, dysuria and flank pain.  Musculoskeletal: Positive for myalgias (Generalized body aches). Negative for arthralgias, back pain and neck pain.  Skin: Negative for color change and rash.  Neurological: Negative for dizziness, weakness and numbness.  Hematological: Negative for adenopathy.     Physical Exam Updated Vital Signs BP (!) 140/94 (BP Location: Right Arm)   Pulse 100   Temp 98.8 F (37.1 C) (Oral)   Resp 20   Ht 5\' 8"  (1.727 m)   Wt 75.8 kg   SpO2 100%   BMI 25.39 kg/m   Physical Exam Vitals signs and nursing note reviewed.  Constitutional:      General: He is not in acute distress.    Appearance: Normal appearance.  He is well-developed. He is not ill-appearing or toxic-appearing.  HENT:     Head: Atraumatic.     Mouth/Throat:     Mouth: Mucous membranes are moist.     Pharynx: Oropharynx is clear.  Neck:     Musculoskeletal: Normal range of motion.  Cardiovascular:     Rate and Rhythm: Normal rate and regular rhythm.     Pulses: Normal pulses.  Pulmonary:     Effort: Pulmonary effort is normal. No respiratory distress.     Breath sounds: Normal breath sounds. No wheezing.  Abdominal:     General: There is no distension.     Palpations: Abdomen is soft. There is no mass.     Tenderness: There is no abdominal tenderness. There is no left CVA tenderness, guarding or  rebound.  Musculoskeletal: Normal range of motion.  Lymphadenopathy:     Cervical: No cervical adenopathy.  Skin:    General: Skin is warm.     Findings: Rash present.     Comments: Focal 4 cm area of a maculopapular rash to the skin of the lower abdomen just below the umbilicus.  No pustules or vesicles.  Some mild scaling.  Neurological:     General: No focal deficit present.     Mental Status: He is alert.     Sensory: No sensory deficit.     Motor: No weakness.      ED Treatments / Results  Labs (all labs ordered are listed, but only abnormal results are displayed) Labs Reviewed  COMPREHENSIVE METABOLIC PANEL - Abnormal; Notable for the following components:      Result Value   AST 65 (*)    ALT 71 (*)    All other components within normal limits  CBC WITH DIFFERENTIAL/PLATELET - Abnormal; Notable for the following components:   WBC 15.7 (*)    Neutro Abs 13.0 (*)    All other components within normal limits  SARS CORONAVIRUS 2 (HOSPITAL ORDER, PERFORMED IN [facility])  LIPASE, BLOOD    EKG None  Radiology No results found.  Procedures Procedures (including critical care time)  Medications Ordered in ED Medications  sodium chloride 0.9 % bolus 1,000 mL (has no administration in time range)  ondansetron (ZOFRAN) injection 4 mg (has no administration in time range)     Initial Impression / Assessment and Plan / ED Course  I have reviewed the triage vital signs and the nursing notes.  Pertinent labs & imaging results that were available during my care of the patient were reviewed by me and considered in my medical decision making (see chart for details).        Patient with left-sided lower abdominal pain that has been waxing and waning for weeks.  Not associated with vomiting, nausea, or diarrhea, although he states he vomited just before I entered the room.  Abdomen is soft and nontender on my exam.  Patient is well-appearing and nontoxic.   Labs show leukocytosis with elevated transaminases, CT abdomen and pelvis ordered.  Chest x-ray is reassuring.  Discussed plan with Dr. Bebe Shaggy, who assumes care of patient.  Final Clinical Impressions(s) / ED Diagnoses   Final diagnoses:  None    ED Discharge Orders    None       Pauline Aus, PA-C 05/28/19 0048    Zadie Rhine, MD 05/28/19 [phone]

## 2019-05-27 NOTE — ED Triage Notes (Signed)
Pt c/o sob x 3 days and states he can't taste anything; pt c/o feeling hot with nausea and states he has a rash on his abdomen

## 2019-05-28 ENCOUNTER — Emergency Department (HOSPITAL_COMMUNITY): Payer: Self-pay

## 2019-05-28 LAB — CBC WITH DIFFERENTIAL/PLATELET
Abs Immature Granulocytes: 0.07 10*3/uL (ref 0.00–0.07)
Basophils Absolute: 0 10*3/uL (ref 0.0–0.1)
Basophils Relative: 0 %
Eosinophils Absolute: 0 10*3/uL (ref 0.0–0.5)
Eosinophils Relative: 0 %
HCT: 44.1 % (ref 39.0–52.0)
Hemoglobin: 14.6 g/dL (ref 13.0–17.0)
Immature Granulocytes: 0 %
Lymphocytes Relative: 10 %
Lymphs Abs: 1.6 10*3/uL (ref 0.7–4.0)
MCH: 30.8 pg (ref 26.0–34.0)
MCHC: 33.1 g/dL (ref 30.0–36.0)
MCV: 93 fL (ref 80.0–100.0)
Monocytes Absolute: 1 10*3/uL (ref 0.1–1.0)
Monocytes Relative: 6 %
Neutro Abs: 13 10*3/uL — ABNORMAL HIGH (ref 1.7–7.7)
Neutrophils Relative %: 84 %
Platelets: 266 10*3/uL (ref 150–400)
RBC: 4.74 MIL/uL (ref 4.22–5.81)
RDW: 13.2 % (ref 11.5–15.5)
WBC: 15.7 10*3/uL — ABNORMAL HIGH (ref 4.0–10.5)
nRBC: 0 % (ref 0.0–0.2)

## 2019-05-28 LAB — COMPREHENSIVE METABOLIC PANEL
ALT: 71 U/L — ABNORMAL HIGH (ref 0–44)
AST: 65 U/L — ABNORMAL HIGH (ref 15–41)
Albumin: 4.2 g/dL (ref 3.5–5.0)
Alkaline Phosphatase: 80 U/L (ref 38–126)
Anion gap: 12 (ref 5–15)
BUN: 10 mg/dL (ref 6–20)
CO2: 26 mmol/L (ref 22–32)
Calcium: 9.7 mg/dL (ref 8.9–10.3)
Chloride: 104 mmol/L (ref 98–111)
Creatinine, Ser: 1.08 mg/dL (ref 0.61–1.24)
GFR calc Af Amer: >60 mL/min (ref >60)
GFR calc non Af Amer: >60 mL/min (ref >60)
Glucose, Bld: 93 mg/dL (ref 70–99)
Potassium: 3.5 mmol/L (ref 3.5–5.1)
Sodium: 142 mmol/L (ref 135–145)
Total Bilirubin: 0.8 mg/dL (ref 0.3–1.2)
Total Protein: 7.5 g/dL (ref 6.5–8.1)

## 2019-05-28 LAB — LIPASE, BLOOD: Lipase: 34 U/L (ref 11–51)

## 2019-05-28 LAB — SARS CORONAVIRUS 2 BY RT PCR (HOSPITAL ORDER, PERFORMED IN ~~LOC~~ HOSPITAL LAB): SARS Coronavirus 2: NEGATIVE

## 2019-05-28 MED ORDER — IOHEXOL 300 MG/ML  SOLN
100.0000 mL | Freq: Once | INTRAMUSCULAR | Status: AC | PRN
  Administered 2019-05-28: 100 mL via INTRAVENOUS

## 2019-06-13 ENCOUNTER — Emergency Department (HOSPITAL_COMMUNITY)
Admission: EM | Admit: 2019-06-13 | Discharge: 2019-06-13 | Disposition: A | Discharge status: Home / Self Care | Payer: Self-pay | Attending: Emergency Medicine | Admitting: Emergency Medicine

## 2019-06-13 ENCOUNTER — Emergency Department (HOSPITAL_COMMUNITY): Payer: Self-pay

## 2019-06-13 ENCOUNTER — Encounter (HOSPITAL_COMMUNITY): Payer: Self-pay | Admitting: Adult Health

## 2019-06-13 ENCOUNTER — Other Ambulatory Visit: Payer: Self-pay

## 2019-06-13 DIAGNOSIS — Z8719 Personal history of other diseases of the digestive system: Secondary | ICD-10-CM

## 2019-06-13 DIAGNOSIS — F17210 Nicotine dependence, cigarettes, uncomplicated: Secondary | ICD-10-CM | POA: Insufficient documentation

## 2019-06-13 DIAGNOSIS — R1013 Epigastric pain: Secondary | ICD-10-CM | POA: Insufficient documentation

## 2019-06-13 DIAGNOSIS — R079 Chest pain, unspecified: Secondary | ICD-10-CM | POA: Insufficient documentation

## 2019-06-13 LAB — CBC
HCT: 45.1 % (ref 39.0–52.0)
Hemoglobin: 15.3 g/dL (ref 13.0–17.0)
MCH: 30.1 pg (ref 26.0–34.0)
MCHC: 33.9 g/dL (ref 30.0–36.0)
MCV: 88.8 fL (ref 80.0–100.0)
Platelets: 203 10*3/uL (ref 150–400)
RBC: 5.08 MIL/uL (ref 4.22–5.81)
RDW: 12.8 % (ref 11.5–15.5)
WBC: 4.6 10*3/uL (ref 4.0–10.5)
nRBC: 0 % (ref 0.0–0.2)

## 2019-06-13 LAB — COMPREHENSIVE METABOLIC PANEL
ALT: 40 U/L (ref 0–44)
AST: 84 U/L — ABNORMAL HIGH (ref 15–41)
Albumin: 4.3 g/dL (ref 3.5–5.0)
Alkaline Phosphatase: 71 U/L (ref 38–126)
Anion gap: 12 (ref 5–15)
BUN: 15 mg/dL (ref 6–20)
CO2: 23 mmol/L (ref 22–32)
Calcium: 9.4 mg/dL (ref 8.9–10.3)
Chloride: 101 mmol/L (ref 98–111)
Creatinine, Ser: 1.11 mg/dL (ref 0.61–1.24)
GFR calc Af Amer: >60 mL/min (ref >60)
GFR calc non Af Amer: >60 mL/min (ref >60)
Glucose, Bld: 107 mg/dL — ABNORMAL HIGH (ref 70–99)
Potassium: 3.4 mmol/L — ABNORMAL LOW (ref 3.5–5.1)
Sodium: 136 mmol/L (ref 135–145)
Total Bilirubin: 2.7 mg/dL — ABNORMAL HIGH (ref 0.3–1.2)
Total Protein: 7.9 g/dL (ref 6.5–8.1)

## 2019-06-13 LAB — TROPONIN I: Troponin I: <0.03 ng/mL (ref <0.03)

## 2019-06-13 LAB — LIPASE, BLOOD: Lipase: 33 U/L (ref 11–51)

## 2019-06-13 MED ORDER — SODIUM CHLORIDE 0.9 % IV SOLN
INTRAVENOUS | Status: DC
  Administered 2019-06-13: 13:00:00 via INTRAVENOUS

## 2019-06-13 MED ORDER — OMEPRAZOLE 20 MG PO CPDR: 20.0000 mg | DELAYED_RELEASE_CAPSULE | Freq: Every day | ORAL | 1 refills | Status: DC

## 2019-06-13 MED ORDER — ONDANSETRON HCL 4 MG/2ML IJ SOLN
4.0000 mg | Freq: Once | INTRAMUSCULAR | Status: AC
  Administered 2019-06-13: 4 mg via INTRAVENOUS
  Filled 2019-06-13: qty 2

## 2019-06-13 MED ORDER — IOHEXOL 300 MG/ML  SOLN
100.0000 mL | Freq: Once | INTRAMUSCULAR | Status: AC | PRN
  Administered 2019-06-13: 100 mL via INTRAVENOUS

## 2019-06-13 MED ORDER — SODIUM CHLORIDE 0.9 % IV BOLUS
1000.0000 mL | Freq: Once | INTRAVENOUS | Status: AC
  Administered 2019-06-13: 11:00:00 1000 mL via INTRAVENOUS

## 2019-06-13 NOTE — ED Provider Notes (Signed)
[facility] Provider Note   CSN: [id number] Arrival date & time: 06/13/19  [id number]     History   Chief Complaint Chief Complaint  Patient presents with  . Abdominal Pain    HPI Victor Marks is a 33 y.o. male.     Patient seen on May 30 with similar complaints.  Patient's been stating that he is been having some abdominal pain mostly in the epigastric area since that time.  Is gotten worse here lately.  Stating that he is having black stools.  Occasionally a little bit of red streaking no vomiting of blood.  But there has been some nausea and vomiting.  Patient had a CT scan done when he was seen on the 30th but he left prior to the results.  That did show some inflammation of the tail of the pancreas or perhaps diverticulitis inflammation in that same area.  Patient denies any fevers or upper respiratory symptoms.     Past Medical History:  Diagnosis Date  . Heart murmur     There are no active problems to display for this patient.   History reviewed. No pertinent surgical history.      Home Medications    Prior to Admission medications   Medication Sig Start Date End Date Taking? Authorizing Provider  aspirin EC 325 MG tablet Take 325 mg by mouth daily.   Yes [provider]  hydrocortisone cream 1 % Apply 1 application topically daily as needed for itching (rash).   Yes [provider]  omeprazole (PRILOSEC) 20 MG capsule Take 1 capsule (20 mg total) by mouth daily. 06/13/19   Vanetta MuldersZackowski, Nelma Phagan, MD  oseltamivir (TAMIFLU) 75 MG capsule Take 1 capsule (75 mg total) by mouth every 12 (twelve) hours. Patient not taking: Reported on 06/13/2019 01/11/19   Zadie RhineWickline, Donald, MD    Family History History reviewed. No pertinent family history.  Social History Social History   Tobacco Use  . Smoking status: Current Some Day Smoker    Packs/day: 1.00    Types: Cigarettes  . Smokeless tobacco: Never Used  Substance Use Topics  .  Alcohol use: No    Comment: occ  . Drug use: No     Allergies   Bee venom and Bee venom   Review of Systems Review of Systems  Constitutional: Negative for chills and fever.  HENT: Negative for congestion, rhinorrhea and sore throat.   Eyes: Negative for visual disturbance.  Respiratory: Negative for cough and shortness of breath.   Cardiovascular: Negative for chest pain and leg swelling.  Gastrointestinal: Positive for abdominal pain, blood in stool, constipation, nausea and vomiting. Negative for diarrhea.  Genitourinary: Negative for dysuria.  Musculoskeletal: Negative for back pain and neck pain.  Skin: Negative for rash.  Neurological: Negative for dizziness, syncope, light-headedness and headaches.  Hematological: Does not bruise/bleed easily.  Psychiatric/Behavioral: Negative for confusion.     Physical Exam Updated Vital Signs BP 116/63   Pulse 79   Temp 98.3 F (36.8 C) (Oral)   Resp 14   Ht 1.727 m (5\' 8" )   SpO2 100%   BMI 25.39 kg/m   Physical Exam Vitals signs and nursing note reviewed.  Constitutional:      General: He is not in acute distress.    Appearance: Normal appearance. He is well-developed. He is not ill-appearing.  HENT:     Head: Normocephalic and atraumatic.  Eyes:     Extraocular Movements: Extraocular movements intact.  Conjunctiva/sclera: Conjunctivae normal.     Pupils: Pupils are equal, round, and reactive to light.  Neck:     Musculoskeletal: Normal range of motion and neck supple.  Cardiovascular:     Rate and Rhythm: Normal rate and regular rhythm.     Heart sounds: No murmur.  Pulmonary:     Effort: Pulmonary effort is normal. No respiratory distress.     Breath sounds: Normal breath sounds.  Abdominal:     General: There is no distension.     Palpations: Abdomen is soft.     Tenderness: There is no abdominal tenderness. There is no guarding.  Genitourinary:    Rectum: Guaiac result negative.  Musculoskeletal:  Normal range of motion.        General: No swelling.  Skin:    General: Skin is warm and dry.     Capillary Refill: Capillary refill takes less than 2 seconds.  Neurological:     General: No focal deficit present.     Mental Status: He is alert and oriented to person, place, and time.      ED Treatments / Results  Labs (all labs ordered are listed, but only abnormal results are displayed) Labs Reviewed  COMPREHENSIVE METABOLIC PANEL - Abnormal; Notable for the following components:      Result Value   Potassium 3.4 (*)    Glucose, Bld 107 (*)    AST 84 (*)    Total Bilirubin 2.7 (*)    All other components within normal limits  CBC  LIPASE, BLOOD  TROPONIN I    EKG EKG Interpretation  Date/Time:  Monday June 13 2019 10:46:48 EDT Ventricular Rate:  67 PR Interval:    QRS Duration: 84 QT Interval:  401 QTC Calculation: 424 R Axis:   87 Text Interpretation:  Sinus rhythm ST elev, probable normal early repol pattern Confirmed by Fredia Sorrow [phone]) on 06/13/2019 10:52:32 AM   Radiology Dg Chest 2 View  Result Date: 06/13/2019 CLINICAL DATA:  Chest pain, weakness, sweats, numbness in left arm EXAM: CHEST - 2 VIEW COMPARISON:  05/27/2019 FINDINGS: The heart size and mediastinal contours are within normal limits. Both lungs are clear. The visualized skeletal structures are unremarkable. IMPRESSION: No acute abnormality of the lungs. Electronically Signed   By: Eddie Candle M.D.   On: 06/13/2019 12:25   Ct Abdomen Pelvis W Contrast  Result Date: 06/13/2019 CLINICAL DATA:  Generalized abdominal pain, black bloody stools and body cramps beginning in May. Worsening pain with indigestion, cramping and discomfort. EXAM: CT ABDOMEN AND PELVIS WITH CONTRAST TECHNIQUE: Multidetector CT imaging of the abdomen and pelvis was performed using the standard protocol following bolus administration of intravenous contrast. CONTRAST:  174mL OMNIPAQUE IOHEXOL 300 MG/ML  SOLN COMPARISON:  CT  abdomen dated 05/28/2019. FINDINGS: Lower chest: No acute abnormality. Hepatobiliary: No focal liver abnormality is seen. No gallstones, gallbladder wall thickening, or biliary dilatation. Pancreas: Unremarkable. No pancreatic ductal dilatation or surrounding inflammatory changes. Spleen: Normal in size without focal abnormality. Adrenals/Urinary Tract: Adrenal glands appear normal. Kidneys are unremarkable without mass, stone or hydronephrosis. No perinephric inflammation seen. No ureteral or bladder calculi identified. Bladder is unremarkable, partially decompressed. Stomach/Bowel: No dilated large or small bowel loops. Appendix is normal. Stomach is unremarkable. Diverticulosis of the LEFT colon but no focal inflammatory change on today's study to suggest acute diverticulitis. Vascular/Lymphatic: No significant vascular findings are present. No enlarged abdominal or pelvic lymph nodes. Reproductive: Prostate is unremarkable. Other: No free fluid or abscess  collection. No free intraperitoneal air. Musculoskeletal: Osseous structures are unremarkable. IMPRESSION: 1. No acute findings. No bowel obstruction or evidence of bowel wall inflammation. No evidence of acute solid organ abnormality. No renal or ureteral calculi. Appendix is normal. 2. Colonic diverticulosis without evidence of acute diverticulitis. Electronically Signed   By: Bary RichardStan  Maynard M.D.   On: 06/13/2019 11:54    Procedures Procedures (including critical care time)  Medications Ordered in ED Medications  0.9 %  sodium chloride infusion ( Intravenous New Bag/Given 06/13/19 1230)  sodium chloride 0.9 % bolus 1,000 mL (0 mLs Intravenous Stopped 06/13/19 1216)  ondansetron (ZOFRAN) injection 4 mg (4 mg Intravenous Given 06/13/19 1054)  iohexol (OMNIPAQUE) 300 MG/ML solution 100 mL (100 mLs Intravenous Contrast Given 06/13/19 1134)     Initial Impression / Assessment and Plan / ED Course  I have reviewed the triage vital signs and the nursing  notes.  Pertinent labs & imaging results that were available during my care of the patient were reviewed by me and considered in my medical decision making (see chart for details).       Patient hemodynamically stable here.  Hemoglobin without evidence of any significant GI bleed.  By history patient has melena.  Abdomen soft nontender.  CT scan of abdomen was repeated because of the abnormal findings on May 30.  But there is no evidence of any abnormal findings on CT scan today.  Making me suspicious for possible peptic ulcer disease.  Patient will be treated with Prilosec.  Give referral to GI medicine.  He will return for any vomiting of blood or any red blood in his bowel movements.  Patient did have some liver function test abnormalities.  There was no evidence of any liver abnormalities or gallbladder abnormalities on CT scan.  Lipase is not elevated.     Final Clinical Impressions(s) / ED Diagnoses   Final diagnoses:  Epigastric abdominal pain  History of melena    ED Discharge Orders         Ordered    omeprazole (PRILOSEC) 20 MG capsule  Daily     06/13/19 1349           Vanetta MuldersZackowski, Bertine Schlottman, MD 06/13/19 1403

## 2019-06-13 NOTE — ED Triage Notes (Signed)
Presents with generalized abdominal pain, black bloody stool and body cramps that began in May. He w as seen here at that time and had a CT scan. He did not stay for the results because he was here a very l;ong time. He reports the pain is worsening and he feels a lot of indigestion, cramping and discomfortt. His last BM was two days ago.

## 2019-06-13 NOTE — Discharge Instructions (Addendum)
Take the Prilosec as directed.  Call gastroenterology for follow-up.  Concern is for a stomach ulcer.  Return for vomiting blood or red blood in your bowel movements.  Or for any new or worse symptoms.  CT scan of the abdomen today without any acute findings.

## 2019-07-28 ENCOUNTER — Emergency Department (HOSPITAL_COMMUNITY)
Admission: EM | Admit: 2019-07-28 | Discharge: 2019-07-29 | Disposition: A | Discharge status: Home / Self Care | Payer: HRSA Program | Attending: Emergency Medicine | Admitting: Emergency Medicine

## 2019-07-28 ENCOUNTER — Emergency Department (HOSPITAL_COMMUNITY): Payer: HRSA Program

## 2019-07-28 ENCOUNTER — Encounter (HOSPITAL_COMMUNITY): Payer: Self-pay | Admitting: *Deleted

## 2019-07-28 ENCOUNTER — Other Ambulatory Visit: Payer: Self-pay

## 2019-07-28 DIAGNOSIS — Z20822 Contact with and (suspected) exposure to covid-19: Secondary | ICD-10-CM

## 2019-07-28 DIAGNOSIS — J029 Acute pharyngitis, unspecified: Secondary | ICD-10-CM | POA: Insufficient documentation

## 2019-07-28 DIAGNOSIS — R0602 Shortness of breath: Secondary | ICD-10-CM | POA: Diagnosis present

## 2019-07-28 DIAGNOSIS — K120 Recurrent oral aphthae: Secondary | ICD-10-CM | POA: Diagnosis not present

## 2019-07-28 DIAGNOSIS — Z20828 Contact with and (suspected) exposure to other viral communicable diseases: Secondary | ICD-10-CM | POA: Insufficient documentation

## 2019-07-28 DIAGNOSIS — Z91030 Bee allergy status: Secondary | ICD-10-CM | POA: Insufficient documentation

## 2019-07-28 DIAGNOSIS — F17210 Nicotine dependence, cigarettes, uncomplicated: Secondary | ICD-10-CM | POA: Diagnosis not present

## 2019-07-28 DIAGNOSIS — R011 Cardiac murmur, unspecified: Secondary | ICD-10-CM | POA: Insufficient documentation

## 2019-07-28 MED ORDER — ACETAMINOPHEN 325 MG PO TABS
650.0000 mg | ORAL_TABLET | Freq: Once | ORAL | Status: AC
  Administered 2019-07-29: 650 mg via ORAL
  Filled 2019-07-28: qty 2

## 2019-07-28 MED ORDER — IBUPROFEN 400 MG PO TABS
400.0000 mg | ORAL_TABLET | Freq: Once | ORAL | Status: AC
  Administered 2019-07-29: 400 mg via ORAL
  Filled 2019-07-28: qty 1

## 2019-07-28 NOTE — ED Provider Notes (Signed)
[facility] Provider Note   CSN: [id number] Arrival date & time: 07/28/19  1931     History   Chief Complaint Chief Complaint  Patient presents with  . Shortness of Breath    HPI Victor Marks is a 33 y.o. male.     Patient with no significant past medical history presenting with concern for coronavirus.  States he has had a sore throat with body aches, cough and shortness of breath for the past 3 days.  Associated pain in his mouth and loss of taste and sore throat and diffuse aches.  Has had chills but no documented fever.  Temperature on arrival was 99.  He states his cough mucus.  He has some chest tightness with coughing and a sore throat.  He complains of a lesion to the right side of his tongue that is making it difficult to swallow.  He is had no appetite and nausea but no vomiting.  Has had several episodes of diarrhea.  Denies abdominal pain or chest pain currently.  Denies headache.  No focal weakness, numbness or tingling.  No pain with urination or blood in the urine.  His job sent him to be tested for coronavirus.  The history is provided by the patient.  Shortness of Breath Associated symptoms: abdominal pain, cough and sore throat   Associated symptoms: no fever, no headaches and no vomiting     Past Medical History:  Diagnosis Date  . Heart murmur     There are no active problems to display for this patient.   History reviewed. No pertinent surgical history.      Home Medications    Prior to Admission medications   Medication Sig Start Date End Date Taking? Authorizing Provider  amoxicillin-clavulanate (AUGMENTIN) 875-125 MG tablet Take 1 tablet by mouth 2 (two) times daily. 10 day course starting on 07/05/2019 07/04/19  Yes [provider]    Family History No family history on file.  Social History Social History   Tobacco Use  . Smoking status: Current Some Day Smoker    Packs/day: 1.00    Types: Cigarettes  .  Smokeless tobacco: Never Used  Substance Use Topics  . Alcohol use: No    Comment: occ  . Drug use: No     Allergies   Bee venom   Review of Systems Review of Systems  Constitutional: Positive for activity change, appetite change, chills and fatigue. Negative for fever.  HENT: Positive for congestion, sore throat and trouble swallowing.   Respiratory: Positive for cough, chest tightness and shortness of breath.   Gastrointestinal: Positive for abdominal pain, diarrhea and nausea. Negative for vomiting.  Genitourinary: Negative for dysuria and hematuria.  Musculoskeletal: Positive for arthralgias and myalgias.  Skin: Negative for wound.  Neurological: Positive for weakness. Negative for dizziness, light-headedness and headaches.    all other systems are negative except as noted in the HPI and PMH.    Physical Exam Updated Vital Signs BP 131/73   Pulse 81   Temp 99 F (37.2 C) (Oral)   Resp 20   Ht 5\' 9"  (1.753 m)   Wt 76.7 kg   SpO2 99%   BMI 24.96 kg/m   Physical Exam Vitals signs and nursing note reviewed.  Constitutional:      General: He is not in acute distress.    Appearance: He is well-developed.     Comments: nontoxic  HENT:     Head: Normocephalic and atraumatic.  Mouth/Throat:     Mouth: Mucous membranes are moist.     Pharynx: Posterior oropharyngeal erythema present. No oropharyngeal exudate.     Comments: Erythematous posterior pharynx, uvula is midline, no asymmetry.  There is an aphthous ulcer on the right lateral. tongue base. Eyes:     Conjunctiva/sclera: Conjunctivae normal.     Pupils: Pupils are equal, round, and reactive to light.  Neck:     Musculoskeletal: Normal range of motion and neck supple.     Comments: No meningismus. Cardiovascular:     Rate and Rhythm: Normal rate and regular rhythm.     Heart sounds: Normal heart sounds. No murmur.  Pulmonary:     Effort: Pulmonary effort is normal. No respiratory distress.     Breath  sounds: Normal breath sounds.  Chest:     Chest wall: No tenderness.  Abdominal:     Palpations: Abdomen is soft.     Tenderness: There is no abdominal tenderness. There is no guarding or rebound.  Musculoskeletal: Normal range of motion.        General: No tenderness.  Skin:    General: Skin is warm.     Capillary Refill: Capillary refill takes less than 2 seconds.     Findings: No rash.  Neurological:     General: No focal deficit present.     Mental Status: He is alert and oriented to person, place, and time. Mental status is at baseline.     Cranial Nerves: No cranial nerve deficit.     Motor: No abnormal muscle tone.     Coordination: Coordination normal.     Comments:  5/5 strength throughout. CN 2-12 intact.Equal grip strength.   Psychiatric:        Behavior: Behavior normal.      ED Treatments / Results  Labs (all labs ordered are listed, but only abnormal results are displayed) Labs Reviewed  CBC WITH DIFFERENTIAL/PLATELET - Abnormal; Notable for the following components:      Result Value   Eosinophils Absolute 0.6 (*)    All other components within normal limits  URINALYSIS, ROUTINE W REFLEX MICROSCOPIC - Abnormal; Notable for the following components:   Hgb urine dipstick SMALL (*)    All other components within normal limits  GROUP A STREP BY PCR  NOVEL CORONAVIRUS, NAA (HOSPITAL ORDER, SEND-OUT TO REF LAB)  COMPREHENSIVE METABOLIC PANEL    EKG EKG Interpretation  Date/Time:  Friday July 29 2019 00:22:51 EDT Ventricular Rate:  76 PR Interval:    QRS Duration: 79 QT Interval:  368 QTC Calculation: 414 R Axis:   87 Text Interpretation:  Sinus rhythm ST elev, probable normal early repol pattern No significant change was found Confirmed by Ezequiel Essex [phone]) on 07/29/2019 12:47:24 AM   Radiology Dg Chest Portable 1 View  Result Date: 07/28/2019 CLINICAL DATA:  Shortness of breath, cough, sore throat, weakness EXAM: PORTABLE CHEST 1 VIEW  COMPARISON:  06/13/2019 FINDINGS: Lungs are clear.  No pleural effusion or pneumothorax. The heart is normal in size. IMPRESSION: No evidence of acute cardiopulmonary disease. Electronically Signed   By: Julian Hy M.D.   On: 07/28/2019 23:58    Procedures Procedures (including critical care time)  Medications Ordered in ED Medications  acetaminophen (TYLENOL) tablet 650 mg (has no administration in time range)  ibuprofen (ADVIL) tablet 400 mg (has no administration in time range)     Initial Impression / Assessment and Plan / ED Course  I have reviewed the triage  vital signs and the nursing notes.  Pertinent labs & imaging results that were available during my care of the patient were reviewed by me and considered in my medical decision making (see chart for details).       3 days of body aches, cough, sore throat, loss of taste.  Patient is nontoxic in no distress.  Lungs are clear without hypoxia.  He will be tested for coronavirus with a send out lab test obtain chest x-ray, rapid strep test and labs.  Results Reassuring.  Chest x-ray negative.  No increased work of breathing or hypoxia.  Tolerating p.o. and ambulatory.  Labs are unremarkable.  Discussed with patient concern for coronavirus will send out test is pending he should quarantine himself.  Instructed not to return to work until he is completed 7 days of having no fever or other symptoms. Viscous lidocaine for aphthous ulcer. Return precautions discussed.  Victor Marks was evaluated in Emergency Department on 07/29/2019 for the symptoms described in the history of present illness. He was evaluated in the context of the global COVID-19 pandemic, which necessitated consideration that the patient might be at risk for infection with the SARS-CoV-2 virus that causes COVID-19. Institutional protocols and algorithms that pertain to the evaluation of patients at risk for COVID-19 are in a state of rapid change based on  information released by regulatory bodies including the CDC and federal and state organizations. These policies and algorithms were followed during the patient's care in the ED.   Final Clinical Impressions(s) / ED Diagnoses   Final diagnoses:  Suspected Covid-19 Virus Infection  Aphthous ulcer    ED Discharge Orders    None       Haylyn Halberg, Jeannett SeniorStephen, MD 07/29/19 [phone]

## 2019-07-28 NOTE — ED Triage Notes (Signed)
Pt c/o sob, cough that is productive at times with yellow sputum, sore throat, lose of taste=, generalized body aches that started yesterday, denies any exposures,

## 2019-07-29 LAB — URINALYSIS, ROUTINE W REFLEX MICROSCOPIC
Bacteria, UA: NONE SEEN
Bilirubin Urine: NEGATIVE
Glucose, UA: NEGATIVE mg/dL
Ketones, ur: NEGATIVE mg/dL
Leukocytes,Ua: NEGATIVE
Nitrite: NEGATIVE
Protein, ur: NEGATIVE mg/dL
Specific Gravity, Urine: 1.028 (ref 1.005–1.030)
pH: 6 (ref 5.0–8.0)

## 2019-07-29 LAB — COMPREHENSIVE METABOLIC PANEL
ALT: 26 U/L (ref 0–44)
AST: 31 U/L (ref 15–41)
Albumin: 4 g/dL (ref 3.5–5.0)
Alkaline Phosphatase: 99 U/L (ref 38–126)
Anion gap: 5 (ref 5–15)
BUN: 18 mg/dL (ref 6–20)
CO2: 27 mmol/L (ref 22–32)
Calcium: 9.1 mg/dL (ref 8.9–10.3)
Chloride: 107 mmol/L (ref 98–111)
Creatinine, Ser: 1.12 mg/dL (ref 0.61–1.24)
GFR calc Af Amer: >60 mL/min (ref >60)
GFR calc non Af Amer: >60 mL/min (ref >60)
Glucose, Bld: 95 mg/dL (ref 70–99)
Potassium: 4.3 mmol/L (ref 3.5–5.1)
Sodium: 139 mmol/L (ref 135–145)
Total Bilirubin: 1 mg/dL (ref 0.3–1.2)
Total Protein: 7.1 g/dL (ref 6.5–8.1)

## 2019-07-29 LAB — CBC WITH DIFFERENTIAL/PLATELET
Abs Immature Granulocytes: 0.02 10*3/uL (ref 0.00–0.07)
Basophils Absolute: 0.1 10*3/uL (ref 0.0–0.1)
Basophils Relative: 1 %
Eosinophils Absolute: 0.6 10*3/uL — ABNORMAL HIGH (ref 0.0–0.5)
Eosinophils Relative: 9 %
HCT: 42.2 % (ref 39.0–52.0)
Hemoglobin: 14 g/dL (ref 13.0–17.0)
Immature Granulocytes: 0 %
Lymphocytes Relative: 23 %
Lymphs Abs: 1.8 10*3/uL (ref 0.7–4.0)
MCH: 30 pg (ref 26.0–34.0)
MCHC: 33.2 g/dL (ref 30.0–36.0)
MCV: 90.6 fL (ref 80.0–100.0)
Monocytes Absolute: 1 10*3/uL (ref 0.1–1.0)
Monocytes Relative: 13 %
Neutro Abs: 4.1 10*3/uL (ref 1.7–7.7)
Neutrophils Relative %: 54 %
Platelets: 231 10*3/uL (ref 150–400)
RBC: 4.66 MIL/uL (ref 4.22–5.81)
RDW: 13.1 % (ref 11.5–15.5)
WBC: 7.6 10*3/uL (ref 4.0–10.5)
nRBC: 0 % (ref 0.0–0.2)

## 2019-07-29 LAB — GROUP A STREP BY PCR: Group A Strep by PCR: NOT DETECTED

## 2019-07-29 MED ORDER — LIDOCAINE VISCOUS HCL 2 % MT SOLN: 15.0000 mL | OROMUCOSAL | 0 refills | Status: DC | PRN

## 2019-07-29 NOTE — ED Notes (Signed)
Water given  

## 2019-07-29 NOTE — Discharge Instructions (Signed)
Person Under Monitoring Name: Victor Marks  Location: [address] [place] [address]   Infection Prevention Recommendations for Individuals Confirmed to have, or Being Evaluated for, 2019 Novel Coronavirus (COVID-19) Infection Who Receive Care at Home  Individuals who are confirmed to have, or are being evaluated for, COVID-19 should follow the prevention steps below until a healthcare provider or local or state health department says they can return to normal activities.  Stay home except to get medical care You should restrict activities outside your home, except for getting medical care. Do not go to work, school, or public areas, and do not use public transportation or taxis.  Call ahead before visiting your doctor Before your medical appointment, call the healthcare provider and tell them that you have, or are being evaluated for, COVID-19 infection. This will help the healthcare providers office take steps to keep other people from getting infected. Ask your healthcare provider to call the local or state health department.  Monitor your symptoms Seek prompt medical attention if your illness is worsening (e.g., difficulty breathing). Before going to your medical appointment, call the healthcare provider and tell them that you have, or are being evaluated for, COVID-19 infection. Ask your healthcare provider to call the local or state health department.  Wear a facemask You should wear a facemask that covers your nose and mouth when you are in the same room with other people and when you visit a healthcare provider. People who live with or visit you should also wear a facemask while they are in the same room with you.  Separate yourself from other people in your home As much as possible, you should stay in a different room from other people in your home. Also, you should use a separate bathroom, if available.  Avoid sharing household items You should not  share dishes, drinking glasses, cups, eating utensils, towels, bedding, or other items with other people in your home. After using these items, you should wash them thoroughly with soap and water.  Cover your coughs and sneezes Cover your mouth and nose with a tissue when you cough or sneeze, or you can cough or sneeze into your sleeve. Throw used tissues in a lined trash can, and immediately wash your hands with soap and water for at least 20 seconds or use an alcohol-based hand rub.  Wash your [facility] your hands often and thoroughly with soap and water for at least 20 seconds. You can use an alcohol-based hand sanitizer if soap and water are not available and if your hands are not visibly dirty. Avoid touching your eyes, nose, and mouth with unwashed hands.   Prevention Steps for Caregivers and Household Members of Individuals Confirmed to have, or Being Evaluated for, COVID-19 Infection Being Cared for in the Home  If you live with, or provide care at home for, a person confirmed to have, or being evaluated for, COVID-19 infection please follow these guidelines to prevent infection:  Follow healthcare providers instructions Make sure that you understand and can help the patient follow any healthcare provider instructions for all care.  Provide for the patients basic needs You should help the patient with basic needs in the home and provide support for getting groceries, prescriptions, and other personal needs.  Monitor the patients symptoms If they are getting sicker, call his or her medical provider and tell them that the patient has, or is being evaluated for, COVID-19 infection. This will help the  healthcare providers office take steps to keep other people from getting infected. Ask the healthcare provider to call the local or state health department.  Limit the number of people who have contact with the patient If possible, have only one caregiver for the  patient. Other household members should stay in another home or place of residence. If this is not possible, they should stay in another room, or be separated from the patient as much as possible. Use a separate bathroom, if available. Restrict visitors who do not have an essential need to be in the home.  Keep older adults, very young children, and other sick people away from the patient Keep older adults, very young children, and those who have compromised immune systems or chronic health conditions away from the patient. This includes people with chronic heart, lung, or kidney conditions, diabetes, and cancer.  Ensure good ventilation Make sure that shared spaces in the home have good air flow, such as from an air conditioner or an opened window, weather permitting.  Wash your hands often Wash your hands often and thoroughly with soap and water for at least 20 seconds. You can use an alcohol based hand sanitizer if soap and water are not available and if your hands are not visibly dirty. Avoid touching your eyes, nose, and mouth with unwashed hands. Use disposable paper towels to dry your hands. If not available, use dedicated cloth towels and replace them when they become wet.  Wear a facemask and gloves Wear a disposable facemask at all times in the room and gloves when you touch or have contact with the patients blood, body fluids, and/or secretions or excretions, such as sweat, saliva, sputum, nasal mucus, vomit, urine, or feces.  Ensure the mask fits over your nose and mouth tightly, and do not touch it during use. Throw out disposable facemasks and gloves after using them. Do not reuse. Wash your hands immediately after removing your facemask and gloves. If your personal clothing becomes contaminated, carefully remove clothing and launder. Wash your hands after handling contaminated clothing. Place all used disposable facemasks, gloves, and other waste in a lined container before  disposing them with other household waste. Remove gloves and wash your hands immediately after handling these items.  Do not share dishes, glasses, or other household items with the patient Avoid sharing household items. You should not share dishes, drinking glasses, cups, eating utensils, towels, bedding, or other items with a patient who is confirmed to have, or being evaluated for, COVID-19 infection. After the person uses these items, you should wash them thoroughly with soap and water.  Wash laundry thoroughly Immediately remove and wash clothes or bedding that have blood, body fluids, and/or secretions or excretions, such as sweat, saliva, sputum, nasal mucus, vomit, urine, or feces, on them. Wear gloves when handling laundry from the patient. Read and follow directions on labels of laundry or clothing items and detergent. In general, wash and dry with the warmest temperatures recommended on the label.  Clean all areas the individual has used often Clean all touchable surfaces, such as counters, tabletops, doorknobs, bathroom fixtures, toilets, phones, keyboards, tablets, and bedside tables, every day. Also, clean any surfaces that may have blood, body fluids, and/or secretions or excretions on them. Wear gloves when cleaning surfaces the patient has come in contact with. Use a diluted bleach solution (e.g., dilute bleach with 1 part bleach and 10 parts water) or a household disinfectant with a label that says EPA-registered for coronaviruses. To  make a bleach solution at home, add 1 tablespoon of bleach to 1 quart (4 cups) of water. For a larger supply, add  cup of bleach to 1 gallon (16 cups) of water. Read labels of cleaning products and follow recommendations provided on product labels. Labels contain instructions for safe and effective use of the cleaning product including precautions you should take when applying the product, such as wearing gloves or eye protection and making sure you  have good ventilation during use of the product. Remove gloves and wash hands immediately after cleaning.  Monitor yourself for signs and symptoms of illness Caregivers and household members are considered close contacts, should monitor their health, and will be asked to limit movement outside of the home to the extent possible. Follow the monitoring steps for close contacts listed on the symptom monitoring form.   ? If you have additional questions, contact your local health department or call the epidemiologist on call at [phone] (available 24/7). ? This guidance is subject to change. For the most up-to-date guidance from [facility], please refer to their website: [email or web address]

## 2019-07-30 LAB — NOVEL CORONAVIRUS, NAA (HOSP ORDER, SEND-OUT TO REF LAB; TAT 18-24 HRS): SARS-CoV-2, NAA: NOT DETECTED

## 2019-09-19 ENCOUNTER — Encounter (HOSPITAL_COMMUNITY): Payer: Self-pay | Admitting: Emergency Medicine

## 2019-09-19 ENCOUNTER — Emergency Department (HOSPITAL_COMMUNITY)
Admission: EM | Admit: 2019-09-19 | Discharge: 2019-09-19 | Disposition: A | Discharge status: Home / Self Care | Payer: Self-pay | Attending: Emergency Medicine | Admitting: Emergency Medicine

## 2019-09-19 ENCOUNTER — Other Ambulatory Visit: Payer: Self-pay

## 2019-09-19 ENCOUNTER — Emergency Department (HOSPITAL_COMMUNITY): Payer: Self-pay

## 2019-09-19 DIAGNOSIS — B9789 Other viral agents as the cause of diseases classified elsewhere: Secondary | ICD-10-CM | POA: Insufficient documentation

## 2019-09-19 DIAGNOSIS — F17210 Nicotine dependence, cigarettes, uncomplicated: Secondary | ICD-10-CM | POA: Insufficient documentation

## 2019-09-19 DIAGNOSIS — Z20828 Contact with and (suspected) exposure to other viral communicable diseases: Secondary | ICD-10-CM | POA: Insufficient documentation

## 2019-09-19 DIAGNOSIS — J988 Other specified respiratory disorders: Secondary | ICD-10-CM

## 2019-09-19 DIAGNOSIS — J069 Acute upper respiratory infection, unspecified: Secondary | ICD-10-CM | POA: Insufficient documentation

## 2019-09-19 LAB — SARS CORONAVIRUS 2 BY RT PCR (HOSPITAL ORDER, PERFORMED IN ~~LOC~~ HOSPITAL LAB): SARS Coronavirus 2: NEGATIVE

## 2019-09-19 MED ORDER — IBUPROFEN 400 MG PO TABS
600.0000 mg | ORAL_TABLET | Freq: Once | ORAL | Status: AC
  Administered 2019-09-19: 600 mg via ORAL
  Filled 2019-09-19: qty 2

## 2019-09-19 NOTE — Discharge Instructions (Signed)
Your COVID test today was negative. Your chest x-ray did not show pneumonia or any other concerning findings.

## 2019-09-19 NOTE — ED Triage Notes (Signed)
Pt has been having shortness of breath with body aches, loss of taste, cough, and fever x 4 days.

## 2019-09-22 NOTE — ED Provider Notes (Signed)
[facility] EMERGENCY DEPARTMENT Provider Note   CSN: [id number] Arrival date & time: 09/19/19  4709     History   Chief Complaint Chief Complaint  Patient presents with  . Shortness of Breath    HPI Victor Marks is a 33 y.o. male.     HPI  33 year old male with numerous complaints.  Patient reports shortness of breath, body aches, loss of taste, cough, fevers.  Cough is nonproductive.  Symptoms have been fairly constant since onset.  No sick contacts that he is aware of.  No unusual leg pain or swelling.  No orthopnea.  No rash.  No acute GI symptoms.  Reports he is otherwise healthy.  Past Medical History:  Diagnosis Date  . Heart murmur     There are no active problems to display for this patient.   History reviewed. No pertinent surgical history.      Home Medications    Prior to Admission medications   Medication Sig Start Date End Date Taking? Authorizing Provider  amoxicillin-clavulanate (AUGMENTIN) 875-125 MG tablet Take 1 tablet by mouth 2 (two) times daily. 10 day course starting on 07/05/2019 07/04/19   [provider]  lidocaine (XYLOCAINE) 2 % solution Use as directed 15 mLs in the mouth or throat as needed for mouth pain. 07/29/19   Glynn Octave, MD    Family History History reviewed. No pertinent family history.  Social History Social History   Tobacco Use  . Smoking status: Current Some Day Smoker    Packs/day: 1.00    Types: Cigarettes  . Smokeless tobacco: Never Used  Substance Use Topics  . Alcohol use: No    Comment: occ  . Drug use: No     Allergies   Bee venom   Review of Systems Review of Systems  All systems reviewed and negative, other than as noted in HPI.  Physical Exam Updated Vital Signs BP (!) 145/78 (BP Location: Right Arm)   Pulse 83   Temp 98.3 F (36.8 C) (Oral)   Resp 17   Ht 6' (1.829 m)   Wt 77 kg   SpO2 98%   BMI 23.02 kg/m   Physical Exam Vitals signs and nursing note reviewed.   Constitutional:      General: He is not in acute distress.    Appearance: He is well-developed.  HENT:     Head: Normocephalic and atraumatic.  Eyes:     General:        Right eye: No discharge.        Left eye: No discharge.     Conjunctiva/sclera: Conjunctivae normal.  Neck:     Musculoskeletal: Neck supple.  Cardiovascular:     Rate and Rhythm: Normal rate and regular rhythm.     Heart sounds: Normal heart sounds. No murmur. No friction rub. No gallop.   Pulmonary:     Effort: Pulmonary effort is normal. No respiratory distress.     Breath sounds: Normal breath sounds.  Abdominal:     General: There is no distension.     Palpations: Abdomen is soft.     Tenderness: There is no abdominal tenderness.  Musculoskeletal:        General: No tenderness.  Skin:    General: Skin is warm and dry.  Neurological:     Mental Status: He is alert.  Psychiatric:        Behavior: Behavior normal.        Thought Content: Thought content normal.  ED Treatments / Results  Labs (all labs ordered are listed, but only abnormal results are displayed) Labs Reviewed  SARS CORONAVIRUS 2 (HOSPITAL ORDER, [facility])    EKG None  Radiology No results found.  Procedures Procedures (including critical care time)  Medications Ordered in ED Medications  ibuprofen (ADVIL) tablet 600 mg (600 mg Oral Given 09/19/19 0950)     Initial Impression / Assessment and Plan / ED Course  I have reviewed the triage vital signs and the nursing notes.  Pertinent labs & imaging results that were available during my care of the patient were reviewed by me and considered in my medical decision making (see chart for details).  Clinical Course as of Sep 22 1319  Mon Sep 19, 2019  9622 SARS CORONAVIRUS 2 (TAT 6-24 HRS) Nasopharyngeal Nasopharyngeal Swab [HT]  0933 SARS CORONAVIRUS 2 (TAT 6-24 HRS) Nasopharyngeal Nasopharyngeal Swab [HT]    Clinical Course User Index  [HT] Rosezella Florida, Student-PA       33 year old male with likely viral URI.  COVID negative.  Doubt bacterial pneumonia or other potentially serious pathology.  Plan is to manage treatment.  Return precautions were discussed.  Outpatient follow-up otherwise.  Victor Marks was evaluated in Emergency Department on 09/22/2019 for the symptoms described in the history of present illness. He was evaluated in the context of the global COVID-19 pandemic, which necessitated consideration that the patient might be at risk for infection with the SARS-CoV-2 virus that causes COVID-19. Institutional protocols and algorithms that pertain to the evaluation of patients at risk for COVID-19 are in a state of rapid change based on information released by regulatory bodies including the CDC and federal and state organizations. These policies and algorithms were followed during the patient's care in the ED.   Final Clinical Impressions(s) / ED Diagnoses   Final diagnoses:  Viral respiratory illness    ED Discharge Orders    None       Virgel Manifold, MD 09/22/19 1320

## 2019-10-19 ENCOUNTER — Other Ambulatory Visit: Payer: Self-pay

## 2019-10-19 DIAGNOSIS — Z20822 Contact with and (suspected) exposure to covid-19: Secondary | ICD-10-CM

## 2019-10-21 LAB — NOVEL CORONAVIRUS, NAA: SARS-CoV-2, NAA: NOT DETECTED

## 2019-11-15 ENCOUNTER — Emergency Department (HOSPITAL_COMMUNITY)
Admission: EM | Admit: 2019-11-15 | Discharge: 2019-11-15 | Disposition: A | Discharge status: Home / Self Care | Payer: Self-pay

## 2019-11-15 ENCOUNTER — Other Ambulatory Visit: Payer: Self-pay

## 2019-11-15 NOTE — ED Notes (Signed)
PT not in waiting room.  Registration staff says they saw pt go outside.  Walked out to parking lot but did not see pt.

## 2019-11-15 NOTE — ED Notes (Signed)
Called for pt x 1 but not in waiting room.

## 2020-01-21 ENCOUNTER — Other Ambulatory Visit: Payer: Self-pay

## 2020-01-21 ENCOUNTER — Encounter (HOSPITAL_COMMUNITY): Payer: Self-pay

## 2020-01-21 ENCOUNTER — Emergency Department (HOSPITAL_COMMUNITY)
Admission: EM | Admit: 2020-01-21 | Discharge: 2020-01-21 | Disposition: A | Discharge status: Home / Self Care | Payer: Self-pay | Attending: Emergency Medicine | Admitting: Emergency Medicine

## 2020-01-21 ENCOUNTER — Emergency Department (HOSPITAL_COMMUNITY): Payer: Self-pay

## 2020-01-21 DIAGNOSIS — R319 Hematuria, unspecified: Secondary | ICD-10-CM | POA: Insufficient documentation

## 2020-01-21 DIAGNOSIS — R1084 Generalized abdominal pain: Secondary | ICD-10-CM | POA: Insufficient documentation

## 2020-01-21 DIAGNOSIS — F17210 Nicotine dependence, cigarettes, uncomplicated: Secondary | ICD-10-CM | POA: Insufficient documentation

## 2020-01-21 LAB — URINALYSIS, ROUTINE W REFLEX MICROSCOPIC
Bacteria, UA: NONE SEEN
Bilirubin Urine: NEGATIVE
Glucose, UA: NEGATIVE mg/dL
Ketones, ur: NEGATIVE mg/dL
Leukocytes,Ua: NEGATIVE
Nitrite: NEGATIVE
Protein, ur: NEGATIVE mg/dL
Specific Gravity, Urine: 1.019 (ref 1.005–1.030)
pH: 7 (ref 5.0–8.0)

## 2020-01-21 LAB — COMPREHENSIVE METABOLIC PANEL
ALT: 24 U/L (ref 0–44)
AST: 23 U/L (ref 15–41)
Albumin: 3.9 g/dL (ref 3.5–5.0)
Alkaline Phosphatase: 82 U/L (ref 38–126)
Anion gap: 7 (ref 5–15)
BUN: 13 mg/dL (ref 6–20)
CO2: 28 mmol/L (ref 22–32)
Calcium: 9.6 mg/dL (ref 8.9–10.3)
Chloride: 101 mmol/L (ref 98–111)
Creatinine, Ser: 0.88 mg/dL (ref 0.61–1.24)
GFR calc Af Amer: >60 mL/min (ref >60)
GFR calc non Af Amer: >60 mL/min (ref >60)
Glucose, Bld: 102 mg/dL — ABNORMAL HIGH (ref 70–99)
Potassium: 4.4 mmol/L (ref 3.5–5.1)
Sodium: 136 mmol/L (ref 135–145)
Total Bilirubin: 1 mg/dL (ref 0.3–1.2)
Total Protein: 7.4 g/dL (ref 6.5–8.1)

## 2020-01-21 LAB — CBC WITH DIFFERENTIAL/PLATELET
Abs Immature Granulocytes: 0.01 10*3/uL (ref 0.00–0.07)
Basophils Absolute: 0 10*3/uL (ref 0.0–0.1)
Basophils Relative: 0 %
Eosinophils Absolute: 0.1 10*3/uL (ref 0.0–0.5)
Eosinophils Relative: 2 %
HCT: 45.3 % (ref 39.0–52.0)
Hemoglobin: 15.3 g/dL (ref 13.0–17.0)
Immature Granulocytes: 0 %
Lymphocytes Relative: 29 %
Lymphs Abs: 1.4 10*3/uL (ref 0.7–4.0)
MCH: 31.2 pg (ref 26.0–34.0)
MCHC: 33.8 g/dL (ref 30.0–36.0)
MCV: 92.4 fL (ref 80.0–100.0)
Monocytes Absolute: 0.5 10*3/uL (ref 0.1–1.0)
Monocytes Relative: 10 %
Neutro Abs: 2.9 10*3/uL (ref 1.7–7.7)
Neutrophils Relative %: 59 %
Platelets: 267 10*3/uL (ref 150–400)
RBC: 4.9 MIL/uL (ref 4.22–5.81)
RDW: 12.1 % (ref 11.5–15.5)
WBC: 4.9 10*3/uL (ref 4.0–10.5)
nRBC: 0 % (ref 0.0–0.2)

## 2020-01-21 LAB — LIPASE, BLOOD: Lipase: 32 U/L (ref 11–51)

## 2020-01-21 MED ORDER — SODIUM CHLORIDE 0.9 % IV BOLUS
1000.0000 mL | Freq: Once | INTRAVENOUS | Status: AC
  Administered 2020-01-21: 1000 mL via INTRAVENOUS

## 2020-01-21 MED ORDER — ONDANSETRON HCL 4 MG/2ML IJ SOLN
4.0000 mg | Freq: Once | INTRAMUSCULAR | Status: AC
  Administered 2020-01-21: 11:00:00 4 mg via INTRAVENOUS
  Filled 2020-01-21: qty 2

## 2020-01-21 MED ORDER — OMEPRAZOLE 20 MG PO CPDR: 20.0000 mg | DELAYED_RELEASE_CAPSULE | Freq: Every day | ORAL | 0 refills | Status: DC

## 2020-01-21 MED ORDER — IOHEXOL 300 MG/ML  SOLN
100.0000 mL | Freq: Once | INTRAMUSCULAR | Status: AC | PRN
  Administered 2020-01-21: 12:00:00 100 mL via INTRAVENOUS

## 2020-01-21 MED ORDER — MORPHINE SULFATE (PF) 4 MG/ML IV SOLN
4.0000 mg | Freq: Once | INTRAVENOUS | Status: AC
  Administered 2020-01-21: 4 mg via INTRAVENOUS
  Filled 2020-01-21: qty 1

## 2020-01-21 NOTE — ED Provider Notes (Signed)
[facility] EMERGENCY DEPARTMENT Provider Note   CSN: [id number] Arrival date & time: 01/21/20  1019     History Chief Complaint  Patient presents with  . Abdominal Pain    Victor Marks is a 34 y.o. male with a history of asymptomatic heart murmer, presenting with a 4 day history of abdominal pain.  He describes generalized bilateral lower abdominal pain in association with intermittent nausea and vomiting (last vomited this am) and a 1 day history of diarrhea which resolved 2 days ago.  He reports pain across his lower back, denies flank pain.  He has noticed some episodes of pink tinged urine - denies painful urination or increased urinary frequency.  His pain radiates into his right scrotum, denies edema, no penile dc or risk for std's.  Denies personal or family hx of kidney stones. Subjective fever due to night sweats.  He has had no treatment prior to arrival.  Last ate yesterday evening but endorses reduced appetite overall.   The history is provided by the patient.       Past Medical History:  Diagnosis Date  . Heart murmur     There are no problems to display for this patient.   History reviewed. No pertinent surgical history.     No family history on file.  Social History   Tobacco Use  . Smoking status: Current Some Day Smoker    Packs/day: 1.00    Types: Cigarettes  . Smokeless tobacco: Never Used  Substance Use Topics  . Alcohol use: Yes    Comment: occ  . Drug use: Yes    Types: Marijuana    Home Medications Prior to Admission medications   Medication Sig Start Date End Date Taking? Authorizing Provider  amoxicillin-clavulanate (AUGMENTIN) 875-125 MG tablet Take 1 tablet by mouth 2 (two) times daily. 10 day course starting on 07/05/2019 07/04/19   [provider]  lidocaine (XYLOCAINE) 2 % solution Use as directed 15 mLs in the mouth or throat as needed for mouth pain. Patient not taking: Reported on 01/21/2020 07/29/19   Glynn Octave, MD   omeprazole (PRILOSEC) 20 MG capsule Take 1 capsule (20 mg total) by mouth daily. 01/21/20   Burgess Amor, PA-C    Allergies    Bee venom  Review of Systems   Review of Systems  Constitutional: Positive for appetite change and fever.  HENT: Negative for congestion and sore throat.   Eyes: Negative.   Respiratory: Negative for chest tightness and shortness of breath.   Cardiovascular: Negative for chest pain.  Gastrointestinal: Positive for abdominal pain, diarrhea, nausea and vomiting.  Genitourinary: Positive for hematuria and testicular pain. Negative for discharge, dysuria, flank pain, scrotal swelling and urgency.  Musculoskeletal: Positive for back pain. Negative for arthralgias, joint swelling and neck pain.  Skin: Negative.  Negative for rash and wound.  Neurological: Negative for dizziness, weakness, light-headedness, numbness and headaches.  Psychiatric/Behavioral: Negative.     Physical Exam Updated Vital Signs BP 123/63   Pulse 67   Temp 97.8 F (36.6 C) (Oral)   Resp 16   Ht 5\' 8"  (1.727 m)   Wt 77.1 kg   SpO2 100%   BMI 25.85 kg/m   Physical Exam Vitals and nursing note reviewed.  Constitutional:      Appearance: He is well-developed.  HENT:     Head: Normocephalic and atraumatic.  Eyes:     Conjunctiva/sclera: Conjunctivae normal.  Cardiovascular:     Rate and Rhythm: Normal rate and  regular rhythm.     Heart sounds: Normal heart sounds.  Pulmonary:     Effort: Pulmonary effort is normal.     Breath sounds: Normal breath sounds. No wheezing.  Abdominal:     General: Bowel sounds are normal.     Palpations: Abdomen is soft.     Tenderness: There is abdominal tenderness in the right lower quadrant and left lower quadrant. There is no right CVA tenderness, left CVA tenderness, guarding or rebound.     Hernia: No hernia is present.     Comments: Mild abdominal tenderness with deep palpation.  Pt endorses low back pain becomes severe bilaterally with  abdominal exam.   Musculoskeletal:        General: Normal range of motion.     Cervical back: Normal range of motion.  Skin:    General: Skin is warm and dry.  Neurological:     Mental Status: He is alert.     ED Results / Procedures / Treatments   Labs (all labs ordered are listed, but only abnormal results are displayed) Labs Reviewed  COMPREHENSIVE METABOLIC PANEL - Abnormal; Notable for the following components:      Result Value   Glucose, Bld 102 (*)    All other components within normal limits  URINALYSIS, ROUTINE W REFLEX MICROSCOPIC - Abnormal; Notable for the following components:   Hgb urine dipstick SMALL (*)    All other components within normal limits  CBC WITH DIFFERENTIAL/PLATELET  LIPASE, BLOOD    EKG None  Radiology CT ABDOMEN PELVIS W CONTRAST  Result Date: 01/21/2020 CLINICAL DATA:  34 year old male with a history of abdominal pain EXAM: CT ABDOMEN AND PELVIS WITH CONTRAST TECHNIQUE: Multidetector CT imaging of the abdomen and pelvis was performed using the standard protocol following bolus administration of intravenous contrast. CONTRAST:  152mL OMNIPAQUE IOHEXOL 300 MG/ML  SOLN COMPARISON:  06/13/2019 FINDINGS: Lower chest: No acute finding Hepatobiliary: Unremarkable liver.  Unremarkable gallbladder. Pancreas: Unremarkable Spleen: Unremarkable Adrenals/Urinary Tract: - Right adrenal gland:  Unremarkable - Left adrenal gland: Unremarkable. - Right kidney: No hydronephrosis, nephrolithiasis, inflammation, or ureteral dilation. No focal lesion. - Left Kidney: No hydronephrosis, nephrolithiasis, inflammation, or ureteral dilation. No focal lesion. - Urinary Bladder: Unremarkable. Stomach/Bowel: - Stomach: Unremarkable. - Small bowel: Unremarkable - Appendix: Normal. - Colon: Unremarkable. Vascular/Lymphatic: Unremarkable vasculature.  No adenopathy. Reproductive: Unremarkable pelvic structures Other: Compared to the CT of 06/13/2019, there is significant reduction  in the volume of subcutaneous fat as well as the volume of intra-abdominal fat. Musculoskeletal: No acute displaced fracture. IMPRESSION: No acute CT finding.  Normal appendix. Significant decrease in the volume of intra-abdominal fat and subcutaneous fat compared to the CT dated 06/13/2019. Correlation with nutritional status may be useful, as low protein state can contribute to GI symptoms. Electronically Signed   By: Corrie Mckusick D.O.   On: 01/21/2020 11:57    Procedures Procedures (including critical care time)  Medications Ordered in ED Medications  morphine 4 MG/ML injection 4 mg (4 mg Intravenous Given 01/21/20 1106)  ondansetron (ZOFRAN) injection 4 mg (4 mg Intravenous Given 01/21/20 1107)  sodium chloride 0.9 % bolus 1,000 mL (1,000 mLs Intravenous New Bag/Given 01/21/20 1148)  iohexol (OMNIPAQUE) 300 MG/ML solution 100 mL (100 mLs Intravenous Contrast Given 01/21/20 1135)    ED Course  I have reviewed the triage vital signs and the nursing notes.  Pertinent labs & imaging results that were available during my care of the patient were reviewed by me and  considered in my medical decision making (see chart for details).    MDM Rules/Calculators/A&P                      Pt's labs reviewed and norma except for microscopic hematuria.  We discussed this finding as well as the signficant weight loss - he denies intentional loss, but states he has had reduced appetite.  He was seen at [facility] 6/20 and told he probably had a stomach ulcer.  Was placed on omeprazole with improved sx but has not taken this med in months.  He describes eating a very heavy meal (states "I can eat everything in sight")  Then will not be hungry for 2-3 days.  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 Denies weakness.  Hematuria possibly from heavy lifting, but referred to urology for further eval of this - kidney function normal, no proteinuria present.    He was placed on  omeprazole.  Discussed diet.  Advised f/u with GI which patient is agreeable, referral to Dr. Karilyn Cota.  Also given referral suggestions for obtaining pcp.  The patient appears reasonably screened and/or stabilized for discharge and I doubt any other medical condition or other [facility] requiring further screening, evaluation, or treatment in the ED at this time prior to discharge.  Final Clinical Impression(s) / ED Diagnoses Final diagnoses:  Generalized abdominal pain  Hematuria, unspecified type    Rx / DC Orders ED Discharge Orders         Ordered    omeprazole (PRILOSEC) 20 MG capsule  Daily     01/21/20 1251           Burgess Amor, Cordelia Poche 01/21/20 1314    Jacalyn Lefevre, MD 01/21/20 1337

## 2020-01-21 NOTE — Discharge Instructions (Signed)
Your CT scan is negative any acute findings including no kidney stones or abdominal infection (specifically no appendicitis).  It is possible your stomach ulcer has returned, especially given your increased acid reflux symptoms.  Take the omeprazole for the next 30 days.  Plan to see the specialists as recommended. I also recommend getting established with a primary MD.  [facility] may be a good option - the [employer] below may also offer some services for you.  [facility]  [address] [place], [place] [address] [phone]  Services [facility] offers a variety of basic health services.  Services include but are not limited to: Blood pressure checks  Heart rate checks  Blood sugar checks  Urine analysis  Rapid strep tests  Pregnancy tests.  Health education and referrals  People needing more complex services will be directed to a physician online. Using these virtual visits, doctors can evaluate and prescribe medicine and treatments. There will be no medication on-site, though [facility] will help patients fill their prescriptions at little to no cost.   For More information please go to: [email or web address]

## 2020-01-21 NOTE — ED Triage Notes (Signed)
Pt reports severe lower abdominal pain that radiates right groin and lower back. X 4 days. Small amount of blood in urine

## 2020-07-25 ENCOUNTER — Emergency Department (HOSPITAL_COMMUNITY): Payer: Self-pay

## 2020-07-25 ENCOUNTER — Emergency Department (HOSPITAL_COMMUNITY)
Admission: EM | Admit: 2020-07-25 | Discharge: 2020-07-25 | Discharge status: Against Medical Advice | Payer: Self-pay | Attending: Emergency Medicine | Admitting: Emergency Medicine

## 2020-07-25 ENCOUNTER — Encounter (HOSPITAL_COMMUNITY): Payer: Self-pay

## 2020-07-25 ENCOUNTER — Other Ambulatory Visit: Payer: Self-pay

## 2020-07-25 DIAGNOSIS — R05 Cough: Secondary | ICD-10-CM | POA: Insufficient documentation

## 2020-07-25 DIAGNOSIS — R0602 Shortness of breath: Secondary | ICD-10-CM | POA: Insufficient documentation

## 2020-07-25 DIAGNOSIS — R062 Wheezing: Secondary | ICD-10-CM | POA: Insufficient documentation

## 2020-07-25 DIAGNOSIS — Z5321 Procedure and treatment not carried out due to patient leaving prior to being seen by health care provider: Secondary | ICD-10-CM | POA: Insufficient documentation

## 2020-07-25 DIAGNOSIS — Z20822 Contact with and (suspected) exposure to covid-19: Secondary | ICD-10-CM | POA: Insufficient documentation

## 2020-07-25 LAB — SARS CORONAVIRUS 2 BY RT PCR (HOSPITAL ORDER, PERFORMED IN ~~LOC~~ HOSPITAL LAB): SARS Coronavirus 2: NEGATIVE

## 2020-07-25 MED ORDER — ALBUTEROL SULFATE HFA 108 (90 BASE) MCG/ACT IN AERS
4.0000 | INHALATION_SPRAY | Freq: Once | RESPIRATORY_TRACT | Status: AC
  Administered 2020-07-25: 4 via RESPIRATORY_TRACT
  Filled 2020-07-25: qty 6.7

## 2020-07-25 MED ORDER — ALBUTEROL SULFATE (2.5 MG/3ML) 0.083% IN NEBU
5.0000 mg | INHALATION_SOLUTION | Freq: Once | RESPIRATORY_TRACT | Status: AC
  Administered 2020-07-25: 5 mg via RESPIRATORY_TRACT
  Filled 2020-07-25: qty 6

## 2020-07-25 MED ORDER — PREDNISONE 50 MG PO TABS
60.0000 mg | ORAL_TABLET | Freq: Once | ORAL | Status: AC
  Administered 2020-07-25: 60 mg via ORAL
  Filled 2020-07-25: qty 1

## 2020-07-25 NOTE — ED Triage Notes (Signed)
Pt reports cough and sob x 2 days.  Pt says symptoms get worse at night.

## 2020-07-25 NOTE — ED Notes (Signed)
Pt not in room, pt not seen in dpt, per tech pt walked out.

## 2021-05-26 ENCOUNTER — Encounter (HOSPITAL_COMMUNITY): Payer: Self-pay

## 2021-05-26 ENCOUNTER — Emergency Department (HOSPITAL_COMMUNITY)
Admission: EM | Admit: 2021-05-26 | Discharge: 2021-05-26 | Disposition: A | Discharge status: Home / Self Care | Payer: Self-pay | Attending: Emergency Medicine | Admitting: Emergency Medicine

## 2021-05-26 ENCOUNTER — Emergency Department (HOSPITAL_COMMUNITY): Payer: Self-pay

## 2021-05-26 ENCOUNTER — Other Ambulatory Visit: Payer: Self-pay

## 2021-05-26 DIAGNOSIS — Z20822 Contact with and (suspected) exposure to covid-19: Secondary | ICD-10-CM | POA: Insufficient documentation

## 2021-05-26 DIAGNOSIS — R079 Chest pain, unspecified: Secondary | ICD-10-CM | POA: Insufficient documentation

## 2021-05-26 DIAGNOSIS — M79604 Pain in right leg: Secondary | ICD-10-CM | POA: Insufficient documentation

## 2021-05-26 DIAGNOSIS — M25511 Pain in right shoulder: Secondary | ICD-10-CM | POA: Insufficient documentation

## 2021-05-26 DIAGNOSIS — M79651 Pain in right thigh: Secondary | ICD-10-CM | POA: Insufficient documentation

## 2021-05-26 DIAGNOSIS — F17210 Nicotine dependence, cigarettes, uncomplicated: Secondary | ICD-10-CM | POA: Insufficient documentation

## 2021-05-26 LAB — BASIC METABOLIC PANEL
Anion gap: 4 — ABNORMAL LOW (ref 5–15)
BUN: 16 mg/dL (ref 6–20)
CO2: 29 mmol/L (ref 22–32)
Calcium: 8.8 mg/dL — ABNORMAL LOW (ref 8.9–10.3)
Chloride: 104 mmol/L (ref 98–111)
Creatinine, Ser: 1.23 mg/dL (ref 0.61–1.24)
GFR, Estimated: >60 mL/min (ref >60)
Glucose, Bld: 86 mg/dL (ref 70–99)
Potassium: 4.6 mmol/L (ref 3.5–5.1)
Sodium: 137 mmol/L (ref 135–145)

## 2021-05-26 LAB — CBC WITH DIFFERENTIAL/PLATELET
Abs Immature Granulocytes: 0.02 10*3/uL (ref 0.00–0.07)
Basophils Absolute: 0 10*3/uL (ref 0.0–0.1)
Basophils Relative: 1 %
Eosinophils Absolute: 0.2 10*3/uL (ref 0.0–0.5)
Eosinophils Relative: 3 %
HCT: 41.5 % (ref 39.0–52.0)
Hemoglobin: 13.4 g/dL (ref 13.0–17.0)
Immature Granulocytes: 0 %
Lymphocytes Relative: 26 %
Lymphs Abs: 1.5 10*3/uL (ref 0.7–4.0)
MCH: 31.9 pg (ref 26.0–34.0)
MCHC: 32.3 g/dL (ref 30.0–36.0)
MCV: 98.8 fL (ref 80.0–100.0)
Monocytes Absolute: 0.7 10*3/uL (ref 0.1–1.0)
Monocytes Relative: 12 %
Neutro Abs: 3.4 10*3/uL (ref 1.7–7.7)
Neutrophils Relative %: 58 %
Platelets: 278 10*3/uL (ref 150–400)
RBC: 4.2 MIL/uL — ABNORMAL LOW (ref 4.22–5.81)
RDW: 13.7 % (ref 11.5–15.5)
WBC: 5.9 10*3/uL (ref 4.0–10.5)
nRBC: 0 % (ref 0.0–0.2)

## 2021-05-26 LAB — RESP PANEL BY RT-PCR (FLU A&B, COVID) ARPGX2
Influenza A by PCR: NEGATIVE
Influenza B by PCR: NEGATIVE
SARS Coronavirus 2 by RT PCR: NEGATIVE

## 2021-05-26 MED ORDER — ACETAMINOPHEN 325 MG PO TABS
650.0000 mg | ORAL_TABLET | Freq: Once | ORAL | Status: AC
  Administered 2021-05-26: 650 mg via ORAL
  Filled 2021-05-26: qty 2

## 2021-05-26 NOTE — ED Triage Notes (Signed)
Pt to er room number 5, pt c/o sharp chest pain for the past two days, ekg complete and given to MD Long, pt states that he also has congestion and general body aches, denies covid vaccination, denies cough or fever, states that he feels generally poorly.

## 2021-05-26 NOTE — Discharge Instructions (Addendum)
Tylenol every 4 hours.  Drink plenty of fluids.  Return if any problems.

## 2021-05-26 NOTE — ED Provider Notes (Addendum)
[facility] EMERGENCY DEPARTMENT Provider Note   CSN: [id number] Arrival date & time: 05/26/21  1032     History Chief Complaint  Patient presents with  . Chest Pain    Victor Marks is a 35 y.o. male.  Pt complains of pain on the right side of his chest and his right upper leg.  Pt reports pain in right chest and right shoulder.  Pain with movement.  Pt complains of pain in right upper thigh.    The history is provided by the patient. No language interpreter was used.       Past Medical History:  Diagnosis Date  . Heart murmur     There are no problems to display for this patient.   History reviewed. No pertinent surgical history.     History reviewed. No pertinent family history.  Social History   Tobacco Use  . Smoking status: Current Some Day Smoker    Packs/day: 1.00    Types: Cigarettes  . Smokeless tobacco: Never Used  Vaping Use  . Vaping Use: Never used  Substance Use Topics  . Alcohol use: Yes    Comment: occ  . Drug use: Yes    Types: Marijuana    Home Medications Prior to Admission medications   Not on File    Allergies    Bee venom  Review of Systems   Review of Systems  Constitutional: Negative for fever.  HENT: Positive for congestion.   Neurological: Negative for light-headedness and numbness.  All other systems reviewed and are negative.   Physical Exam Updated Vital Signs BP 136/78   Pulse 63   Temp 99 F (37.2 C) (Oral)   Resp 10   Ht 5\' 10"  (1.778 m)   Wt 78 kg   SpO2 100%   BMI 24.68 kg/m   Physical Exam Vitals and nursing note reviewed.  Constitutional:      Appearance: He is well-developed.  HENT:     Head: Normocephalic.  Cardiovascular:     Rate and Rhythm: Normal rate and regular rhythm.     Heart sounds: Normal heart sounds.  Pulmonary:     Effort: Pulmonary effort is normal.  Abdominal:     General: Bowel sounds are normal. There is no distension.     Palpations: Abdomen is soft.   Musculoskeletal:        General: Normal range of motion.     Cervical back: Normal range of motion.  Skin:    General: Skin is warm.  Neurological:     General: No focal deficit present.     Mental Status: He is alert and oriented to person, place, and time.  Psychiatric:        Mood and Affect: Mood normal.     ED Results / Procedures / Treatments   Labs (all labs ordered are listed, but only abnormal results are displayed) Labs Reviewed  CBC WITH DIFFERENTIAL/PLATELET - Abnormal; Notable for the following components:      Result Value   RBC 4.20 (*)    All other components within normal limits  BASIC METABOLIC PANEL - Abnormal; Notable for the following components:   Calcium 8.8 (*)    Anion gap 4 (*)    All other components within normal limits  RESP PANEL BY RT-PCR (FLU A&B, COVID) [id number]    EKG EKG Interpretation  Date/Time:  Sunday May 26 2021 10:42:15 EDT Ventricular Rate:  80 PR Interval:  130 QRS Duration: 87 QT Interval:  390 QTC Calculation: 450 R Axis:   92 Text Interpretation: Sinus rhythm Borderline right axis deviation Consider left ventricular hypertrophy ST elev, probable normal early repol pattern Similar to prior tracing July 2021 Confirmed by Alona Bene [phone]) on 05/26/2021 10:47:35 AM   Radiology DG Chest Port 1 View  Result Date: 05/26/2021 CLINICAL DATA:  Cough. EXAM: PORTABLE CHEST 1 VIEW COMPARISON:  July 25, 2020 FINDINGS: Cardiomediastinal silhouette is normal. Mediastinal contours appear intact. There is no evidence of focal airspace consolidation, pleural effusion or pneumothorax. Osseous structures are without acute abnormality. Soft tissues are grossly normal. IMPRESSION: No active disease. Electronically Signed   By: Ted Mcalpine M.D.   On: 05/26/2021 11:42    Procedures Procedures   Medications Ordered in ED Medications  acetaminophen (TYLENOL) tablet 650 mg (650 mg Oral Given 05/26/21 1134)    ED Course  I have  reviewed the triage vital signs and the nursing notes.  Pertinent labs & imaging results that were available during my care of the patient were reviewed by me and considered in my medical decision making (see chart for details).    MDM Rules/Calculators/A&P                          MDM:  EKG  No acute abnormality,  Chest xray normal.  Labs normal, covid normal.  Pt counseled on findings.  Pt advised tylenol every 4 hours.  Final Clinical Impression(s) / ED Diagnoses Final diagnoses:  Right leg pain  Right-sided chest pain    Rx / DC Orders ED Discharge Orders    None    An After Visit Summary was printed and given to the patient.    Elson Areas, PA-C 05/26/21 1500    Elson Areas, [place] 05/26/21 1507    Maia Plan, MD 05/27/21 [phone]

## 2024-01-16 ENCOUNTER — Telehealth: Payer: 59 | Admitting: Physician Assistant

## 2024-01-16 NOTE — Progress Notes (Signed)
 The patient no-showed for appointment despite this provider sending direct link, reaching out via phone with no response and waiting for at least 10 minutes from appointment time for patient to join. They will be marked as a NS for this appointment/time.   Laure Kidney, PA-C
